# Patient Record
Sex: Male | Born: 1965 | Race: White | Hispanic: No | Marital: Single | Smoking: Current every day smoker
Health system: Southern US, Community
[De-identification: ages and names within clinical notes are randomized; demographics above are authoritative.]

## PROBLEM LIST (undated history)

## (undated) DIAGNOSIS — I10 Essential (primary) hypertension: Secondary | ICD-10-CM

## (undated) HISTORY — DX: Essential (primary) hypertension: I10

---

## 2019-11-23 DIAGNOSIS — R739 Hyperglycemia, unspecified: Secondary | ICD-10-CM

## 2019-11-23 DIAGNOSIS — R7309 Other abnormal glucose: Secondary | ICD-10-CM

## 2019-11-23 DIAGNOSIS — E559 Vitamin D deficiency, unspecified: Secondary | ICD-10-CM

## 2019-11-23 DIAGNOSIS — E786 Lipoprotein deficiency: Secondary | ICD-10-CM

## 2019-11-23 DIAGNOSIS — E119 Type 2 diabetes mellitus without complications: Secondary | ICD-10-CM

## 2019-11-23 HISTORY — DX: Hyperglycemia, unspecified: R73.9

## 2019-11-23 HISTORY — DX: Type 2 diabetes mellitus without complications: E11.9

## 2019-11-23 HISTORY — DX: Vitamin D deficiency, unspecified: E55.9

## 2019-11-23 HISTORY — DX: Other abnormal glucose: R73.09

## 2019-11-23 HISTORY — DX: Lipoprotein deficiency: E78.6

## 2019-12-04 ENCOUNTER — Telehealth: Payer: Self-pay | Admitting: Family Medicine

## 2019-12-04 NOTE — Telephone Encounter (Signed)
Pt called and reminded of appointment 

## 2019-12-07 ENCOUNTER — Encounter: Payer: Self-pay | Admitting: Family Medicine

## 2019-12-07 ENCOUNTER — Ambulatory Visit (INDEPENDENT_AMBULATORY_CARE_PROVIDER_SITE_OTHER): Payer: Self-pay | Admitting: Family Medicine

## 2019-12-07 ENCOUNTER — Other Ambulatory Visit: Payer: Self-pay

## 2019-12-07 VITALS — BP 142/94 | HR 94 | Temp 98.3°F | Ht 69.0 in | Wt 222.6 lb

## 2019-12-07 DIAGNOSIS — I10 Essential (primary) hypertension: Secondary | ICD-10-CM

## 2019-12-07 DIAGNOSIS — R7309 Other abnormal glucose: Secondary | ICD-10-CM

## 2019-12-07 DIAGNOSIS — Z09 Encounter for follow-up examination after completed treatment for conditions other than malignant neoplasm: Secondary | ICD-10-CM

## 2019-12-07 DIAGNOSIS — Z7689 Persons encountering health services in other specified circumstances: Secondary | ICD-10-CM

## 2019-12-07 DIAGNOSIS — Z0000 Encounter for general adult medical examination without abnormal findings: Secondary | ICD-10-CM

## 2019-12-07 DIAGNOSIS — E119 Type 2 diabetes mellitus without complications: Secondary | ICD-10-CM

## 2019-12-07 LAB — POCT GLYCOSYLATED HEMOGLOBIN (HGB A1C): Hemoglobin A1C: 12.3 % — AB (ref 4.0–5.6)

## 2019-12-07 LAB — GLUCOSE, POCT (MANUAL RESULT ENTRY): POC Glucose: 12.3 mg/dl — AB (ref 70–99)

## 2019-12-07 LAB — POCT URINALYSIS DIPSTICK
Bilirubin, UA: NEGATIVE
Blood, UA: NEGATIVE
Glucose, UA: POSITIVE — AB
Ketones, UA: NEGATIVE
Leukocytes, UA: NEGATIVE
Nitrite, UA: NEGATIVE
Protein, UA: POSITIVE — AB
Spec Grav, UA: 1.02 (ref 1.010–1.025)
Urobilinogen, UA: 0.2 E.U./dL
pH, UA: 5 (ref 5.0–8.0)

## 2019-12-07 MED ORDER — METFORMIN HCL 1000 MG PO TABS: 1000.0000 mg | ORAL_TABLET | Freq: Two times a day (BID) | ORAL | 3 refills | Status: DC

## 2019-12-07 MED ORDER — GLIPIZIDE 10 MG PO TABS: 10.0000 mg | ORAL_TABLET | Freq: Two times a day (BID) | ORAL | 3 refills | Status: DC

## 2019-12-07 MED ORDER — AMLODIPINE-ATORVASTATIN 10-10 MG PO TABS: 1.0000 | ORAL_TABLET | Freq: Every day | ORAL | 3 refills | Status: DC

## 2019-12-07 MED ORDER — BLOOD GLUCOSE METER KIT: PACK | 0 refills | Status: AC

## 2019-12-07 MED FILL — !TRUE METRIX BLOOD GLUCOSE: 30 days supply | Qty: 1 | Fill #0

## 2019-12-07 MED FILL — glipiZIDE 10 MG TABS: 10 | 30 days supply | Qty: 60 | Fill #0

## 2019-12-07 MED FILL — TRUE METRIX TEST STRIP: 25 days supply | Qty: 100 | Fill #0

## 2019-12-07 MED FILL — TRUEplus LANCETS 28G MISC: 25 days supply | Qty: 100 | Fill #0

## 2019-12-07 MED FILL — metFORMIN HCL 1000 MG TABS: 1000 | 30 days supply | Qty: 60 | Fill #0

## 2019-12-07 NOTE — Progress Notes (Signed)
Patient [facility]   Established Patient Office Visit  Subjective:  Patient ID: Johnny Hamilton, male    DOB: 12-20-1965  Age: 54 y.o. MRN: [id number]  CC:  Chief Complaint  Patient presents with  . New Patient (Initial Visit)    HTN    HPI Johnny Hamilton is a 54 year old male who presents to Establish Care today.   Past Medical History:  Diagnosis Date  . Hypertension     Current Status: This will be his initial office visit with me. He was previously not seeing a physician for his PCP needs. Since his last office visit, He is doing well with no complaints. He has elevated blood pressures today, which he states that he has not taken his anit-hypertensive medication as of yet. He denies visual changes, chest pain, cough, shortness of breath, heart palpitations, and falls. He has occasional headaches and dizziness with position changes. Denies severe headaches, confusion, seizures, double vision, and blurred vision, nausea and vomiting. He denies fatigue, frequent urination, blurred vision, excessive hunger, excessive thirst, weight gain, weight loss, and poor wound healing. He continues to check his feet regularly. He denies fevers, chills, recent infections, weight loss, and night sweats. No reports of GI problems such as diarrhea, and constipation. He has no reports of blood in stools, dysuria and hematuria. No depression or anxiety, and denies suicidal ideations, homicidal ideations, or auditory hallucinations. He denies pain today.   History reviewed. No pertinent surgical history.  No family history on file.  Social History   Socioeconomic History  . Marital status: Single    Spouse name: Not on file  . Number of children: Not on file  . Years of education: Not on file  . Highest education level: Not on file  Occupational History  . Not on file  Tobacco Use  . Smoking status: Current Every Day Smoker    Types: Cigarettes  . Smokeless  tobacco: Never Used  Substance and Sexual Activity  . Alcohol use: Not Currently  . Drug use: Not Currently  . Sexual activity: Not Currently  Other Topics Concern  . Not on file  Social History Narrative  . Not on file   Social Determinants of Health   Financial Resource Strain:   . Difficulty of Paying Living Expenses:   Food Insecurity:   . Worried About Charity fundraiser in the Last Year:   . Arboriculturist in the Last Year:   Transportation Needs:   . Film/video editor (Medical):   Marland Kitchen Lack of Transportation (Non-Medical):   Physical Activity:   . Days of Exercise per Week:   . Minutes of Exercise per Session:   Stress:   . Feeling of Stress :   Social Connections:   . Frequency of Communication with Friends and Family:   . Frequency of Social Gatherings with Friends and Family:   . Attends Religious Services:   . Active Member of Clubs or Organizations:   . Attends Archivist Meetings:   Marland Kitchen Marital Status:   Intimate Partner Violence:   . Fear of Current or Ex-Partner:   . Emotionally Abused:   Marland Kitchen Physically Abused:   . Sexually Abused:     Outpatient Medications Prior to Visit  Medication Sig Dispense Refill  . amlodipine-atorvastatin (CADUET) 10-10 MG tablet Take 1 tablet by mouth daily.     No facility-administered medications prior to visit.    No Known  Allergies  ROS Review of Systems  Constitutional: Negative.   HENT: Negative.   Eyes: Negative.   Respiratory: Negative.   Cardiovascular: Negative.   Gastrointestinal: Negative.   Endocrine: Negative.   Genitourinary: Negative.   Musculoskeletal: Negative.   Skin: Negative.   Allergic/Immunologic: Negative.   Neurological: Positive for dizziness (occasional ) and headaches (occasional ).  Hematological: Negative.   Psychiatric/Behavioral: Negative.       Objective:    Physical Exam  Constitutional: He is oriented to person, place, and time. He appears well-developed and  well-nourished.  HENT:  Head: Normocephalic and atraumatic.  Eyes: Conjunctivae are normal.  Cardiovascular: Normal rate, regular rhythm, normal heart sounds and intact distal pulses.  Pulmonary/Chest: Effort normal and breath sounds normal.  Abdominal: Soft. Bowel sounds are normal.  Musculoskeletal:        General: Normal range of motion.     Cervical back: Normal range of motion and neck supple.  Neurological: He is alert and oriented to person, place, and time. He has normal reflexes.  Skin: Skin is warm.  Psychiatric: He has a normal mood and affect. His behavior is normal. Judgment and thought content normal.  Nursing note and vitals reviewed.   BP (!) 142/94   Pulse 94   Temp 98.3 F (36.8 C)   Ht '5\' 9"'$  (1.753 m)   Wt 222 lb 9.6 oz (101 kg)   BMI 32.87 kg/m  Wt Readings from Last 3 Encounters:  12/07/19 222 lb 9.6 oz (101 kg)     Health Maintenance Due  Topic Date Due  . URINE MICROALBUMIN  Never done  . HIV Screening  Never done  . TETANUS/TDAP  Never done  . COLONOSCOPY  Never done  . INFLUENZA VACCINE  Never done    There are no preventive care reminders to display for this patient.  Lab Results  Component Value Date   TSH 3.270 12/07/2019   Lab Results  Component Value Date   WBC 6.7 12/07/2019   HGB 17.4 12/07/2019   HCT 52.5 (H) 12/07/2019   MCV 94 12/07/2019   PLT 242 12/07/2019   Lab Results  Component Value Date   NA 135 12/07/2019   K 4.5 12/07/2019   CO2 20 12/07/2019   GLUCOSE 339 (H) 12/07/2019   BUN 10 12/07/2019   CREATININE 0.88 12/07/2019   BILITOT 0.3 12/07/2019   ALKPHOS 78 12/07/2019   AST 21 12/07/2019   ALT 28 12/07/2019   PROT 7.6 12/07/2019   ALBUMIN 4.8 12/07/2019   CALCIUM 9.7 12/07/2019   Lab Results  Component Value Date   CHOL 196 12/07/2019   Lab Results  Component Value Date   HDL 41 12/07/2019   Lab Results  Component Value Date   LDLCALC 116 (H) 12/07/2019   Lab Results  Component Value Date    TRIG 224 (H) 12/07/2019   Lab Results  Component Value Date   CHOLHDL 4.8 12/07/2019   Lab Results  Component Value Date   HGBA1C 12.3 (A) 12/07/2019      Assessment & Plan:   1. Encounter to establish care  2. Newly diagnosed diabetes ([place]) He will continue medication as prescribed, to decrease foods/beverages high in sugars and carbs and follow Heart Healthy or DASH diet. Increase physical activity to at least 30 minutes cardio exercise daily.  - blood glucose meter kit and supplies; Dispense based on patient and insurance preference. Use up to four times daily as directed. (FOR ICD-10 E10.9, E11.9).  Dispense: 1 each; Refill: 0 - amlodipine-atorvastatin (CADUET) 10-10 MG tablet; Take 1 tablet by mouth daily.  Dispense: 30 tablet; Refill: 3 - glipiZIDE (GLUCOTROL) 10 MG tablet; Take 1 tablet (10 mg total) by mouth 2 (two) times daily before a meal.  Dispense: 60 tablet; Refill: 3 - metFORMIN (GLUCOPHAGE) 1000 MG tablet; Take 1 tablet (1,000 mg total) by mouth 2 (two) times daily with a meal.  Dispense: 180 tablet; Refill: 3  3. Hemoglobin A1c > 9% indicating poor diabetic control Hgb A1c increasd at 12.3 today. We will continue to monitor.   4. Hypertension, unspecified type Blood pressure is stable. He will continue to take medications as prescribed, to decrease high sodium intake, excessive alcohol intake, increase potassium intake, smoking cessation, and increase physical activity of at least 30 minutes of cardio activity daily. He will continue to follow Heart Healthy or DASH diet. - amlodipine-atorvastatin (CADUET) 10-10 MG tablet; Take 1 tablet by mouth daily.  Dispense: 30 tablet; Refill: 3 - glipiZIDE (GLUCOTROL) 10 MG tablet; Take 1 tablet (10 mg total) by mouth 2 (two) times daily before a meal.  Dispense: 60 tablet; Refill: 3 - metFORMIN (GLUCOPHAGE) 1000 MG tablet; Take 1 tablet (1,000 mg total) by mouth 2 (two) times daily with a meal.  Dispense: 180 tablet; Refill:  3  4. Health care maintenance - POCT glycosylated hemoglobin (Hb A1C) - POCT urinalysis dipstick - POCT glucose (manual entry) - CBC with Differential - Comprehensive metabolic panel - Lipid Panel - TSH - Vitamin B12 - Vitamin D, 25-hydroxy  5. Follow up He will follow up in 3 months.   Meds ordered this encounter  Medications  . blood glucose meter kit and supplies    Sig: Dispense based on patient and insurance preference. Use up to four times daily as directed. (FOR ICD-10 E10.9, E11.9).    Dispense:  1 each    Refill:  0    Order Specific Question:   Number of strips    Answer:   100    Order Specific Question:   Number of lancets    Answer:   100  . DISCONTD: glipiZIDE (GLUCOTROL) 10 MG tablet    Sig: Take 1 tablet (10 mg total) by mouth 2 (two) times daily before a meal.    Dispense:  60 tablet    Refill:  3  . DISCONTD: metFORMIN (GLUCOPHAGE) 1000 MG tablet    Sig: Take 1 tablet (1,000 mg total) by mouth 2 (two) times daily with a meal.    Dispense:  180 tablet    Refill:  3  . DISCONTD: amlodipine-atorvastatin (CADUET) 10-10 MG tablet    Sig: Take 1 tablet by mouth daily.    Dispense:  30 tablet    Refill:  3  . amlodipine-atorvastatin (CADUET) 10-10 MG tablet    Sig: Take 1 tablet by mouth daily.    Dispense:  30 tablet    Refill:  3  . glipiZIDE (GLUCOTROL) 10 MG tablet    Sig: Take 1 tablet (10 mg total) by mouth 2 (two) times daily before a meal.    Dispense:  60 tablet    Refill:  3  . metFORMIN (GLUCOPHAGE) 1000 MG tablet    Sig: Take 1 tablet (1,000 mg total) by mouth 2 (two) times daily with a meal.    Dispense:  180 tablet    Refill:  3    Orders Placed This Encounter  Procedures  . CBC with Differential  . Comprehensive  metabolic panel  . Lipid Panel  . TSH  . Vitamin B12  . Vitamin D, 25-hydroxy  . POCT glycosylated hemoglobin (Hb A1C)  . POCT urinalysis dipstick  . POCT glucose (manual entry)    Referral Orders  No referral(s)  requested today    Kathe Becton,  MSN, FNP-BC [address] [place], [place] [address] [phone] fax   Problem List Items Addressed This Visit    None    Visit Diagnoses    Encounter to establish care    -  Primary   Newly diagnosed diabetes ([place])       Relevant Medications   blood glucose meter kit and supplies   amlodipine-atorvastatin (CADUET) 10-10 MG tablet   glipiZIDE (GLUCOTROL) 10 MG tablet   metFORMIN (GLUCOPHAGE) 1000 MG tablet   Hypertension, unspecified type       Relevant Medications   amlodipine-atorvastatin (CADUET) 10-10 MG tablet   glipiZIDE (GLUCOTROL) 10 MG tablet   metFORMIN (GLUCOPHAGE) 1000 MG tablet   Health care maintenance       Relevant Orders   POCT glycosylated hemoglobin (Hb A1C) (Completed)   POCT urinalysis dipstick (Completed)   POCT glucose (manual entry) (Completed)   CBC with Differential (Completed)   Comprehensive metabolic panel (Completed)   Lipid Panel (Completed)   TSH (Completed)   Vitamin B12 (Completed)   Vitamin D, 25-hydroxy (Completed)   Follow up          Meds ordered this encounter  Medications  . blood glucose meter kit and supplies    Sig: Dispense based on patient and insurance preference. Use up to four times daily as directed. (FOR ICD-10 E10.9, E11.9).    Dispense:  1 each    Refill:  0    Order Specific Question:   Number of strips    Answer:   100    Order Specific Question:   Number of lancets    Answer:   100  . DISCONTD: glipiZIDE (GLUCOTROL) 10 MG tablet    Sig: Take 1 tablet (10 mg total) by mouth 2 (two) times daily before a meal.    Dispense:  60 tablet    Refill:  3  . DISCONTD: metFORMIN (GLUCOPHAGE) 1000 MG tablet    Sig: Take 1 tablet (1,000 mg total) by mouth 2 (two) times daily with a meal.    Dispense:  180 tablet    Refill:  3  . DISCONTD: amlodipine-atorvastatin (CADUET) 10-10 MG tablet      Sig: Take 1 tablet by mouth daily.    Dispense:  30 tablet    Refill:  3  . amlodipine-atorvastatin (CADUET) 10-10 MG tablet    Sig: Take 1 tablet by mouth daily.    Dispense:  30 tablet    Refill:  3  . glipiZIDE (GLUCOTROL) 10 MG tablet    Sig: Take 1 tablet (10 mg total) by mouth 2 (two) times daily before a meal.    Dispense:  60 tablet    Refill:  3  . metFORMIN (GLUCOPHAGE) 1000 MG tablet    Sig: Take 1 tablet (1,000 mg total) by mouth 2 (two) times daily with a meal.    Dispense:  180 tablet    Refill:  3    Follow-up: Return in about 3 months (around 03/08/2020).    Azzie Glatter, FNP

## 2019-12-07 NOTE — Patient Instructions (Signed)
Glipizide tablets What is this medicine? GLIPIZIDE (GLIP i zide) helps to treat type 2 diabetes. Treatment is combined with diet and exercise. The medicine helps your body to use insulin better. This medicine may be used for other purposes; ask your health care provider or pharmacist if you have questions. COMMON BRAND NAME(S): Glucotrol What should I tell my health care provider before I take this medicine? They need to know if you have any of these conditions:  diabetic ketoacidosis  glucose-6-phosphate dehydrogenase deficiency  heart disease  kidney disease  liver disease  porphyria  severe infection or injury  thyroid disease  an unusual or allergic reaction to glipizide, sulfa drugs, other medicines, foods, dyes, or preservatives  pregnant or trying to get pregnant  breast-feeding How should I use this medicine? Take this medicine by mouth. Swallow with a drink of water. Do not take with food. Take it 30 minutes before a meal. Follow the directions on the prescription label. If you take this medicine once a day, take it 30 minutes before breakfast. Take your doses at the same time each day. Do not take more often than directed. Talk to your pediatrician regarding the use of this medicine in children. Special care may be needed. Elderly patients over 78 years old may have a stronger reaction and need a smaller dose. Overdosage: If you think you have taken too much of this medicine contact a poison control center or emergency room at once. NOTE: This medicine is only for you. Do not share this medicine with others. What if I miss a dose? If you miss a dose, take it as soon as you can. If it is almost time for your next dose, take only that dose. Do not take double or extra doses. What may interact with this medicine?  bosentan  chloramphenicol  cisapride  clarithromycin  medicines for fungal or yeast infections  metoclopramide  probenecid  warfarin Many  medications may cause an increase or decrease in blood sugar, these include:  alcohol containing beverages  aspirin and aspirin-like drugs  chloramphenicol  chromium  diuretics  male hormones, like estrogens or progestins and birth control pills  heart medicines  isoniazid  male hormones or anabolic steroids  medicines for weight loss  medicines for allergies, asthma, cold, or cough  medicines for mental problems  medicines called MAO Inhibitors like Nardil, Parnate, Marplan, Eldepryl  niacin  NSAIDs, medicines for pain and inflammation, like ibuprofen or naproxen  pentamidine  phenytoin  probenecid  quinolone antibiotics like ciprofloxacin, levofloxacin, ofloxacin  some herbal dietary supplements  steroid medicines like prednisone or cortisone  thyroid medicine This list may not describe all possible interactions. Give your health care provider a list of all the medicines, herbs, non-prescription drugs, or dietary supplements you use. Also tell them if you smoke, drink alcohol, or use illegal drugs. Some items may interact with your medicine. What should I watch for while using this medicine? Visit your doctor or health care professional for regular checks on your progress. A test called the HbA1C (A1C) will be monitored. This is a simple blood test. It measures your blood sugar control over the last 2 to 3 months. You will receive this test every 3 to 6 months. Learn how to check your blood sugar. Learn the symptoms of low and high blood sugar and how to manage them. Always carry a quick-source of sugar with you in case you have symptoms of low blood sugar. Examples include hard sugar candy or  glucose tablets. Make sure others know that you can choke if you eat or drink when you develop serious symptoms of low blood sugar, such as seizures or unconsciousness. They must get medical help at once. Tell your doctor or health care professional if you have high blood  sugar. You might need to change the dose of your medicine. If you are sick or exercising more than usual, you might need to change the dose of your medicine. Do not skip meals. Ask your doctor or health care professional if you should avoid alcohol. Many nonprescription cough and cold products contain sugar or alcohol. These can affect blood sugar. This medicine can make you more sensitive to the sun. Keep out of the sun. If you cannot avoid being in the sun, wear protective clothing and use sunscreen. Do not use sun lamps or tanning beds/booths. Wear a medical ID bracelet or chain, and carry a card that describes your disease and details of your medicine and dosage times. What side effects may I notice from receiving this medicine? Side effects that you should report to your doctor or health care professional as soon as possible:  allergic reactions like skin rash, itching or hives, swelling of the face, lips, or tongue  breathing problems  dark urine  fever, chills, sore throat  signs and symptoms of low blood sugar such as feeling anxious, confusion, dizziness, increased hunger, unusually weak or tired, sweating, shakiness, cold, irritable, headache, blurred vision, fast heartbeat, loss of consciousness  unusual bleeding or bruising  yellowing of the eyes or skin Side effects that usually do not require medical attention (report to your doctor or health care professional if they continue or are bothersome):  diarrhea  dizziness  headache  heartburn  nausea  stomach gas This list may not describe all possible side effects. Call your doctor for medical advice about side effects. You may report side effects to FDA at 1-800-FDA-1088. Where should I keep my medicine? Keep out of the reach of children. Store at room temperature below 30 degrees C (86 degrees F). Throw away any unused medicine after the expiration date. NOTE: This sheet is a summary. It may not cover all possible  information. If you have questions about this medicine, talk to your doctor, pharmacist, or health care provider.  2020 Elsevier/Gold Standard (2012-12-24 14:42:46) Metformin tablets What is this medicine? METFORMIN (met FOR min) is used to treat type 2 diabetes. It helps to control blood sugar. Treatment is combined with diet and exercise. This medicine can be used alone or with other medicines for diabetes. This medicine may be used for other purposes; ask your health care provider or pharmacist if you have questions. COMMON BRAND NAME(S): Glucophage What should I tell my health care provider before I take this medicine? They need to know if you have any of these conditions:  anemia  dehydration  heart disease  frequently drink alcohol-containing beverages  kidney disease  liver disease  polycystic ovary syndrome  serious infection or injury  vomiting  an unusual or allergic reaction to metformin, other medicines, foods, dyes, or preservatives  pregnant or trying to get pregnant  breast-feeding How should I use this medicine? Take this medicine by mouth with a glass of water. Follow the directions on the prescription label. Take this medicine with food. Take your medicine at regular intervals. Do not take your medicine more often than directed. Do not stop taking except on your doctor's advice. Talk to your pediatrician regarding the use  of this medicine in children. While this drug may be prescribed for children as young as 83 years of age for selected conditions, precautions do apply. Overdosage: If you think you have taken too much of this medicine contact a poison control center or emergency room at once. NOTE: This medicine is only for you. Do not share this medicine with others. What if I miss a dose? If you miss a dose, take it as soon as you can. If it is almost time for your next dose, take only that dose. Do not take double or extra doses. What may interact with  this medicine? Do not take this medicine with any of the following medications:  certain contrast medicines given before X-rays, CT scans, MRI, or other procedures  dofetilide This medicine may also interact with the following medications:  acetazolamide  alcohol  certain antivirals for HIV or hepatitis  certain medicines for blood pressure, heart disease, irregular heart beat  cimetidine  dichlorphenamide  digoxin  diuretics  male hormones, like estrogens or progestins and birth control pills  glycopyrrolate  isoniazid  lamotrigine  memantine  methazolamide  metoclopramide  midodrine  niacin  phenothiazines like chlorpromazine, mesoridazine, prochlorperazine, thioridazine  phenytoin  ranolazine  steroid medicines like prednisone or cortisone  stimulant medicines for attention disorders, weight loss, or to stay awake  thyroid medicines  topiramate  trospium  vandetanib  zonisamide This list may not describe all possible interactions. Give your health care provider a list of all the medicines, herbs, non-prescription drugs, or dietary supplements you use. Also tell them if you smoke, drink alcohol, or use illegal drugs. Some items may interact with your medicine. What should I watch for while using this medicine? Visit your doctor or health care professional for regular checks on your progress. A test called the HbA1C (A1C) will be monitored. This is a simple blood test. It measures your blood sugar control over the last 2 to 3 months. You will receive this test every 3 to 6 months. Learn how to check your blood sugar. Learn the symptoms of low and high blood sugar and how to manage them. Always carry a quick-source of sugar with you in case you have symptoms of low blood sugar. Examples include hard sugar candy or glucose tablets. Make sure others know that you can choke if you eat or drink when you develop serious symptoms of low blood sugar, such  as seizures or unconsciousness. They must get medical help at once. Tell your doctor or health care professional if you have high blood sugar. You might need to change the dose of your medicine. If you are sick or exercising more than usual, you might need to change the dose of your medicine. Do not skip meals. Ask your doctor or health care professional if you should avoid alcohol. Many nonprescription cough and cold products contain sugar or alcohol. These can affect blood sugar. This medicine may cause ovulation in premenopausal women who do not have regular monthly periods. This may increase your chances of becoming pregnant. You should not take this medicine if you become pregnant or think you may be pregnant. Talk with your doctor or health care professional about your birth control options while taking this medicine. Contact your doctor or health care professional right away if you think you are pregnant. If you are going to need surgery, a MRI, CT scan, or other procedure, tell your doctor that you are taking this medicine. You may need to stop taking  this medicine before the procedure. Wear a medical ID bracelet or chain, and carry a card that describes your disease and details of your medicine and dosage times. This medicine may cause a decrease in folic acid and vitamin B12. You should make sure that you get enough vitamins while you are taking this medicine. Discuss the foods you eat and the vitamins you take with your health care professional. What side effects may I notice from receiving this medicine? Side effects that you should report to your doctor or health care professional as soon as possible:  allergic reactions like skin rash, itching or hives, swelling of the face, lips, or tongue  breathing problems  feeling faint or lightheaded, falls  muscle aches or pains  signs and symptoms of low blood sugar such as feeling anxious, confusion, dizziness, increased hunger, unusually  weak or tired, sweating, shakiness, cold, irritable, headache, blurred vision, fast heartbeat, loss of consciousness  slow or irregular heartbeat  unusual stomach pain or discomfort  unusually tired or weak Side effects that usually do not require medical attention (report to your doctor or health care professional if they continue or are bothersome):  diarrhea  headache  heartburn  metallic taste in mouth  nausea  stomach gas, upset This list may not describe all possible side effects. Call your doctor for medical advice about side effects. You may report side effects to FDA at 1-800-FDA-1088. Where should I keep my medicine? Keep out of the reach of children. Store at room temperature between 15 and 30 degrees C (59 and 86 degrees F). Protect from moisture and light. Throw away any unused medicine after the expiration date. NOTE: This sheet is a summary. It may not cover all possible information. If you have questions about this medicine, talk to your doctor, pharmacist, or health care provider.  2020 Elsevier/Gold Standard (2017-10-17 19:15:19) Diabetes Mellitus and Nutrition, Adult When you have diabetes (diabetes mellitus), it is very important to have healthy eating habits because your blood sugar (glucose) levels are greatly affected by what you eat and drink. Eating healthy foods in the appropriate amounts, at about the same times every day, can help you:  Control your blood glucose.  Lower your risk of heart disease.  Improve your blood pressure.  Reach or maintain a healthy weight. Every person with diabetes is different, and each person has different needs for a meal plan. Your health care provider may recommend that you work with a diet and nutrition specialist (dietitian) to make a meal plan that is best for you. Your meal plan may vary depending on factors such as:  The calories you need.  The medicines you take.  Your weight.  Your blood glucose, blood  pressure, and cholesterol levels.  Your activity level.  Other health conditions you have, such as heart or kidney disease. How do carbohydrates affect me? Carbohydrates, also called carbs, affect your blood glucose level more than any other type of food. Eating carbs naturally raises the amount of glucose in your blood. Carb counting is a method for keeping track of how many carbs you eat. Counting carbs is important to keep your blood glucose at a healthy level, especially if you use insulin or take certain oral diabetes medicines. It is important to know how many carbs you can safely have in each meal. This is different for every person. Your dietitian can help you calculate how many carbs you should have at each meal and for each snack. Foods that contain  carbs include:  Bread, cereal, rice, pasta, and crackers.  Potatoes and corn.  Peas, beans, and lentils.  Milk and yogurt.  Fruit and juice.  Desserts, such as cakes, cookies, ice cream, and candy. How does alcohol affect me? Alcohol can cause a sudden decrease in blood glucose (hypoglycemia), especially if you use insulin or take certain oral diabetes medicines. Hypoglycemia can be a life-threatening condition. Symptoms of hypoglycemia (sleepiness, dizziness, and confusion) are similar to symptoms of having too much alcohol. If your health care provider says that alcohol is safe for you, follow these guidelines:  Limit alcohol intake to no more than 1 drink per day for nonpregnant women and 2 drinks per day for men. One drink equals 12 oz of beer, 5 oz of wine, or 1 oz of hard liquor.  Do not drink on an empty stomach.  Keep yourself hydrated with water, diet soda, or unsweetened iced tea.  Keep in mind that regular soda, juice, and other mixers may contain a lot of sugar and must be counted as carbs. What are tips for following this plan?  Reading food labels  Start by checking the serving size on the "Nutrition Facts"  label of packaged foods and drinks. The amount of calories, carbs, fats, and other nutrients listed on the label is based on one serving of the item. Many items contain more than one serving per package.  Check the total grams (g) of carbs in one serving. You can calculate the number of servings of carbs in one serving by dividing the total carbs by 15. For example, if a food has 30 g of total carbs, it would be equal to 2 servings of carbs.  Check the number of grams (g) of saturated and trans fats in one serving. Choose foods that have low or no amount of these fats.  Check the number of milligrams (mg) of salt (sodium) in one serving. Most people should limit total sodium intake to less than 2,300 mg per day.  Always check the nutrition information of foods labeled as "low-fat" or "nonfat". These foods may be higher in added sugar or refined carbs and should be avoided.  Talk to your dietitian to identify your daily goals for nutrients listed on the label. Shopping  Avoid buying canned, premade, or processed foods. These foods tend to be high in fat, sodium, and added sugar.  Shop around the outside edge of the grocery store. This includes fresh fruits and vegetables, bulk grains, fresh meats, and fresh dairy. Cooking  Use low-heat cooking methods, such as baking, instead of high-heat cooking methods like deep frying.  Cook using healthy oils, such as olive, canola, or sunflower oil.  Avoid cooking with butter, cream, or high-fat meats. Meal planning  Eat meals and snacks regularly, preferably at the same times every day. Avoid going long periods of time without eating.  Eat foods high in fiber, such as fresh fruits, vegetables, beans, and whole grains. Talk to your dietitian about how many servings of carbs you can eat at each meal.  Eat 4-6 ounces (oz) of lean protein each day, such as lean meat, chicken, fish, eggs, or tofu. One oz of lean protein is equal to: ? 1 oz of meat,  chicken, or fish. ? 1 egg. ?  cup of tofu.  Eat some foods each day that contain healthy fats, such as avocado, nuts, seeds, and fish. Lifestyle  Check your blood glucose regularly.  Exercise regularly as told by your health care  provider. This may include: ? 150 minutes of moderate-intensity or vigorous-intensity exercise each week. This could be brisk walking, biking, or water aerobics. ? Stretching and doing strength exercises, such as yoga or weightlifting, at least 2 times a week.  Take medicines as told by your health care provider.  Do not use any products that contain nicotine or tobacco, such as cigarettes and e-cigarettes. If you need help quitting, ask your health care provider.  Work with a Social worker or diabetes educator to identify strategies to manage stress and any emotional and social challenges. Questions to ask a health care provider  Do I need to meet with a diabetes educator?  Do I need to meet with a dietitian?  What number can I call if I have questions?  When are the best times to check my blood glucose? Where to find more information:  American Diabetes Association: diabetes.org  Academy of Nutrition and Dietetics: www.eatright.[employer] of Diabetes and Digestive and Kidney Diseases (NIH): [employer] Summary  A healthy meal plan will help you control your blood glucose and maintain a healthy lifestyle.  Working with a diet and nutrition specialist (dietitian) can help you make a meal plan that is best for you.  Keep in mind that carbohydrates (carbs) and alcohol have immediate effects on your blood glucose levels. It is important to count carbs and to use alcohol carefully. This information is not intended to replace advice given to you by your health care provider. Make sure you discuss any questions you have with your health care provider. Document Revised: 08/23/2017 Document Reviewed: 10/15/2016 Elsevier Patient Education   2020 [place] Eating Plan DASH stands for "Dietary Approaches to Stop Hypertension." The DASH eating plan is a healthy eating plan that has been shown to reduce high blood pressure (hypertension). It may also reduce your risk for type 2 diabetes, heart disease, and stroke. The DASH eating plan may also help with weight loss. What are tips for following this plan?  General guidelines  Avoid eating more than 2,300 mg (milligrams) of salt (sodium) a day. If you have hypertension, you may need to reduce your sodium intake to 1,500 mg a day.  Limit alcohol intake to no more than 1 drink a day for nonpregnant women and 2 drinks a day for men. One drink equals 12 oz of beer, 5 oz of wine, or 1 oz of hard liquor.  Work with your health care provider to maintain a healthy body weight or to lose weight. Ask what an ideal weight is for you.  Get at least 30 minutes of exercise that causes your heart to beat faster (aerobic exercise) most days of the week. Activities may include walking, swimming, or biking.  Work with your health care provider or diet and nutrition specialist (dietitian) to adjust your eating plan to your individual calorie needs. Reading food labels   Check food labels for the amount of sodium per serving. Choose foods with less than 5 percent of the Daily Value of sodium. Generally, foods with less than 300 mg of sodium per serving fit into this eating plan.  To find whole grains, look for the word "whole" as the first word in the ingredient list. Shopping  Buy products labeled as "low-sodium" or "no salt added."  Buy fresh foods. Avoid canned foods and premade or frozen meals. Cooking  Avoid adding salt when cooking. Use salt-free seasonings or herbs instead of table salt or sea salt. Check with your health  care provider or pharmacist before using salt substitutes.  Do not fry foods. Cook foods using healthy methods such as baking, boiling, grilling, and broiling  instead.  Cook with heart-healthy oils, such as olive, canola, soybean, or sunflower oil. Meal planning  Eat a balanced diet that includes: ? 5 or more servings of fruits and vegetables each day. At each meal, try to fill half of your plate with fruits and vegetables. ? Up to 6-8 servings of whole grains each day. ? Less than 6 oz of lean meat, poultry, or fish each day. A 3-oz serving of meat is about the same size as a deck of cards. One egg equals 1 oz. ? 2 servings of low-fat dairy each day. ? A serving of nuts, seeds, or beans 5 times each week. ? Heart-healthy fats. Healthy fats called Omega-3 fatty acids are found in foods such as flaxseeds and coldwater fish, like sardines, salmon, and mackerel.  Limit how much you eat of the following: ? Canned or prepackaged foods. ? Food that is high in trans fat, such as fried foods. ? Food that is high in saturated fat, such as fatty meat. ? Sweets, desserts, sugary drinks, and other foods with added sugar. ? Full-fat dairy products.  Do not salt foods before eating.  Try to eat at least 2 vegetarian meals each week.  Eat more home-cooked food and less restaurant, buffet, and fast food.  When eating at a restaurant, ask that your food be prepared with less salt or no salt, if possible. What foods are recommended? The items listed may not be a complete list. Talk with your dietitian about what dietary choices are best for you. Grains Whole-grain or whole-wheat bread. Whole-grain or whole-wheat pasta. Brown rice. [place]. Bulgur. Whole-grain and low-sodium cereals. Pita bread. Low-fat, low-sodium crackers. Whole-wheat flour tortillas. Vegetables Fresh or frozen vegetables (raw, steamed, roasted, or grilled). Low-sodium or reduced-sodium tomato and vegetable juice. Low-sodium or reduced-sodium tomato sauce and tomato paste. Low-sodium or reduced-sodium canned vegetables. Fruits All fresh, dried, or frozen fruit. Canned fruit in  natural juice (without added sugar). Meat and other protein foods Skinless chicken or [place]. Ground chicken or [place]. Pork with fat trimmed off. Fish and seafood. Egg whites. Dried beans, peas, or lentils. Unsalted nuts, nut butters, and seeds. Unsalted canned beans. Lean cuts of beef with fat trimmed off. Low-sodium, lean deli meat. Dairy Low-fat (1%) or fat-free (skim) milk. Fat-free, low-fat, or reduced-fat cheeses. Nonfat, low-sodium ricotta or cottage cheese. Low-fat or nonfat yogurt. Low-fat, low-sodium cheese. Fats and oils Soft margarine without trans fats. Vegetable oil. Low-fat, reduced-fat, or light mayonnaise and salad dressings (reduced-sodium). Canola, safflower, olive, soybean, and sunflower oils. Avocado. Seasoning and other foods Herbs. Spices. Seasoning mixes without salt. Unsalted popcorn and pretzels. Fat-free sweets. What foods are not recommended? The items listed may not be a complete list. Talk with your dietitian about what dietary choices are best for you. Grains Baked goods made with fat, such as croissants, muffins, or some breads. Dry pasta or rice meal packs. Vegetables Creamed or fried vegetables. Vegetables in a cheese sauce. Regular canned vegetables (not low-sodium or reduced-sodium). Regular canned tomato sauce and paste (not low-sodium or reduced-sodium). Regular tomato and vegetable juice (not low-sodium or reduced-sodium). Angie Fava. Olives. Fruits Canned fruit in a light or heavy syrup. Fried fruit. Fruit in cream or butter sauce. Meat and other protein foods Fatty cuts of meat. Ribs. Fried meat. Berniece Salines. Sausage. Bologna and other processed lunch meats. Salami. Fatback.  Hotdogs. Bratwurst. Salted nuts and seeds. Canned beans with added salt. Canned or smoked fish. Whole eggs or egg yolks. Chicken or [place] with skin. Dairy Whole or 2% milk, cream, and half-and-half. Whole or full-fat cream cheese. Whole-fat or sweetened yogurt. Full-fat cheese. Nondairy  creamers. Whipped toppings. Processed cheese and cheese spreads. Fats and oils Butter. Stick margarine. Lard. Shortening. Ghee. Bacon fat. Tropical oils, such as coconut, palm kernel, or palm oil. Seasoning and other foods Salted popcorn and pretzels. Onion salt, garlic salt, seasoned salt, table salt, and sea salt. Worcestershire sauce. Tartar sauce. Barbecue sauce. Teriyaki sauce. Soy sauce, including reduced-sodium. Steak sauce. Canned and packaged gravies. Fish sauce. Oyster sauce. Cocktail sauce. Horseradish that you find on the shelf. Ketchup. Mustard. Meat flavorings and tenderizers. Bouillon cubes. Hot sauce and Tabasco sauce. Premade or packaged marinades. Premade or packaged taco seasonings. Relishes. Regular salad dressings. Where to find more information:  National Heart, Lung, and [place]: https://wilson-eaton.com/  American Heart Association: www.heart.org Summary  The DASH eating plan is a healthy eating plan that has been shown to reduce high blood pressure (hypertension). It may also reduce your risk for type 2 diabetes, heart disease, and stroke.  With the DASH eating plan, you should limit salt (sodium) intake to 2,300 mg a day. If you have hypertension, you may need to reduce your sodium intake to 1,500 mg a day.  When on the DASH eating plan, aim to eat more fresh fruits and vegetables, whole grains, lean proteins, low-fat dairy, and heart-healthy fats.  Work with your health care provider or diet and nutrition specialist (dietitian) to adjust your eating plan to your individual calorie needs. This information is not intended to replace advice given to you by your health care provider. Make sure you discuss any questions you have with your health care provider. Document Revised: 08/23/2017 Document Reviewed: 09/03/2016 Elsevier Patient Education  2020 [employer].

## 2019-12-08 ENCOUNTER — Telehealth: Payer: Self-pay

## 2019-12-08 DIAGNOSIS — I10 Essential (primary) hypertension: Secondary | ICD-10-CM | POA: Insufficient documentation

## 2019-12-08 DIAGNOSIS — R7309 Other abnormal glucose: Secondary | ICD-10-CM | POA: Insufficient documentation

## 2019-12-08 DIAGNOSIS — E119 Type 2 diabetes mellitus without complications: Secondary | ICD-10-CM | POA: Insufficient documentation

## 2019-12-08 NOTE — Telephone Encounter (Signed)
[facility] called:   Amlodipine-Atorvastatin together is to costly for Johnny Hamilton.  It is cheaper to dispense the 2 medication separately.   Please advise or re-submit.

## 2019-12-09 ENCOUNTER — Telehealth: Payer: Self-pay

## 2019-12-09 ENCOUNTER — Other Ambulatory Visit: Payer: Self-pay | Admitting: Family Medicine

## 2019-12-09 DIAGNOSIS — I10 Essential (primary) hypertension: Secondary | ICD-10-CM

## 2019-12-09 DIAGNOSIS — E119 Type 2 diabetes mellitus without complications: Secondary | ICD-10-CM

## 2019-12-09 MED ORDER — AMLODIPINE BESYLATE 10 MG PO TABS: 10.0000 mg | ORAL_TABLET | Freq: Every day | ORAL | 3 refills | Status: DC

## 2019-12-09 MED ORDER — ATORVASTATIN CALCIUM 10 MG PO TABS: 10.0000 mg | ORAL_TABLET | Freq: Every day | ORAL | 3 refills | Status: DC

## 2019-12-09 MED FILL — ATORVASTATIN 10 MG TABLET: 10 | 90 days supply | Qty: 90 | Fill #0

## 2019-12-09 MED FILL — AMLODIPINE BESYLATE 10 MG T: 10 | 90 days supply | Qty: 90 | Fill #0

## 2019-12-09 NOTE — Telephone Encounter (Signed)
Patient called today:   Mr. Gittleman will need the Amlodipine & Atorvastatin sent in separately. The combined medication is too expensive.   Please send to the [facility]   Thank you.

## 2019-12-14 ENCOUNTER — Encounter: Payer: Self-pay | Admitting: Family Medicine

## 2019-12-14 ENCOUNTER — Other Ambulatory Visit: Payer: Self-pay | Admitting: Family Medicine

## 2019-12-14 DIAGNOSIS — E559 Vitamin D deficiency, unspecified: Secondary | ICD-10-CM

## 2019-12-14 MED ORDER — VITAMIN D (ERGOCALCIFEROL) 1.25 MG (50000 UNIT) PO CAPS: 50000.0000 [IU] | ORAL_CAPSULE | ORAL | 6 refills | Status: DC

## 2019-12-16 ENCOUNTER — Telehealth: Payer: Self-pay | Admitting: Family Medicine

## 2019-12-16 LAB — LIPID PANEL
Chol/HDL Ratio: 4.8 ratio (ref 0.0–5.0)
Cholesterol, Total: 196 mg/dL (ref 100–199)
HDL: 41 mg/dL (ref >39)
LDL Chol Calc (NIH): 116 mg/dL — ABNORMAL HIGH (ref 0–99)
Triglycerides: 224 mg/dL — ABNORMAL HIGH (ref 0–149)
VLDL Cholesterol Cal: 39 mg/dL (ref 5–40)

## 2019-12-16 LAB — CBC WITH DIFFERENTIAL/PLATELET
Basophils Absolute: 0.1 10*3/uL (ref 0.0–0.2)
Basos: 1 %
EOS (ABSOLUTE): 0.1 10*3/uL (ref 0.0–0.4)
Eos: 2 %
Hematocrit: 52.5 % — ABNORMAL HIGH (ref 37.5–51.0)
Hemoglobin: 17.4 g/dL (ref 13.0–17.7)
Immature Grans (Abs): 0 10*3/uL (ref 0.0–0.1)
Immature Granulocytes: 0 %
Lymphocytes Absolute: 2.5 10*3/uL (ref 0.7–3.1)
Lymphs: 38 %
MCH: 31.2 pg (ref 26.6–33.0)
MCHC: 33.1 g/dL (ref 31.5–35.7)
MCV: 94 fL (ref 79–97)
Monocytes Absolute: 0.5 10*3/uL (ref 0.1–0.9)
Monocytes: 7 %
Neutrophils Absolute: 3.5 10*3/uL (ref 1.4–7.0)
Neutrophils: 52 %
Platelets: 242 10*3/uL (ref 150–450)
RBC: 5.58 x10E6/uL (ref 4.14–5.80)
RDW: 12.9 % (ref 11.6–15.4)
WBC: 6.7 10*3/uL (ref 3.4–10.8)

## 2019-12-16 LAB — COMPREHENSIVE METABOLIC PANEL
ALT: 28 IU/L (ref 0–44)
AST: 21 IU/L (ref 0–40)
Albumin/Globulin Ratio: 1.7 (ref 1.2–2.2)
Albumin: 4.8 g/dL (ref 3.8–4.9)
Alkaline Phosphatase: 78 IU/L (ref 39–117)
BUN/Creatinine Ratio: 11 (ref 9–20)
BUN: 10 mg/dL (ref 6–24)
Bilirubin Total: 0.3 mg/dL (ref 0.0–1.2)
CO2: 20 mmol/L (ref 20–29)
Calcium: 9.7 mg/dL (ref 8.7–10.2)
Chloride: 97 mmol/L (ref 96–106)
Creatinine, Ser: 0.88 mg/dL (ref 0.76–1.27)
GFR calc Af Amer: 115 mL/min/{1.73_m2} (ref >59)
GFR calc non Af Amer: 99 mL/min/{1.73_m2} (ref >59)
Globulin, Total: 2.8 g/dL (ref 1.5–4.5)
Glucose: 339 mg/dL — ABNORMAL HIGH (ref 65–99)
Potassium: 4.5 mmol/L (ref 3.5–5.2)
Sodium: 135 mmol/L (ref 134–144)
Total Protein: 7.6 g/dL (ref 6.0–8.5)

## 2019-12-16 LAB — VITAMIN B12: Vitamin B-12: 470 pg/mL (ref 232–1245)

## 2019-12-16 LAB — VITAMIN D 25 HYDROXY (VIT D DEFICIENCY, FRACTURES): Vit D, 25-Hydroxy: 11.2 ng/mL — ABNORMAL LOW (ref 30.0–100.0)

## 2019-12-16 LAB — TSH: TSH: 3.27 u[IU]/mL (ref 0.450–4.500)

## 2019-12-16 MED FILL — VIT D2 1.25 MG (50,000 UNIT: 1.25 MG | 35 days supply | Qty: 5 | Fill #0

## 2019-12-16 NOTE — Telephone Encounter (Signed)
Done

## 2019-12-23 ENCOUNTER — Ambulatory Visit: Payer: Self-pay | Attending: Family Medicine

## 2019-12-23 ENCOUNTER — Other Ambulatory Visit: Payer: Self-pay

## 2019-12-30 ENCOUNTER — Other Ambulatory Visit: Payer: Self-pay

## 2019-12-30 ENCOUNTER — Other Ambulatory Visit: Payer: Self-pay | Admitting: Family Medicine

## 2019-12-30 ENCOUNTER — Encounter: Payer: Self-pay | Admitting: Family Medicine

## 2019-12-30 ENCOUNTER — Telehealth: Payer: Self-pay | Admitting: Family Medicine

## 2019-12-30 ENCOUNTER — Ambulatory Visit (INDEPENDENT_AMBULATORY_CARE_PROVIDER_SITE_OTHER): Payer: Self-pay | Admitting: Family Medicine

## 2019-12-30 VITALS — BP 136/79 | HR 90 | Temp 98.6°F | Ht 69.0 in | Wt 202.4 lb

## 2019-12-30 DIAGNOSIS — I10 Essential (primary) hypertension: Secondary | ICD-10-CM

## 2019-12-30 DIAGNOSIS — R7309 Other abnormal glucose: Secondary | ICD-10-CM

## 2019-12-30 DIAGNOSIS — E119 Type 2 diabetes mellitus without complications: Secondary | ICD-10-CM

## 2019-12-30 DIAGNOSIS — E162 Hypoglycemia, unspecified: Secondary | ICD-10-CM

## 2019-12-30 DIAGNOSIS — Z09 Encounter for follow-up examination after completed treatment for conditions other than malignant neoplasm: Secondary | ICD-10-CM

## 2019-12-30 LAB — POCT URINALYSIS DIPSTICK
Bilirubin, UA: NEGATIVE
Blood, UA: NEGATIVE
Glucose, UA: POSITIVE — AB
Ketones, UA: NEGATIVE
Leukocytes, UA: NEGATIVE
Nitrite, UA: NEGATIVE
Protein, UA: POSITIVE — AB
Spec Grav, UA: 1.025 (ref 1.010–1.025)
Urobilinogen, UA: 0.2 E.U./dL
pH, UA: 5.5 (ref 5.0–8.0)

## 2019-12-30 LAB — GLUCOSE, POCT (MANUAL RESULT ENTRY): POC Glucose: 115 mg/dl — AB (ref 70–99)

## 2019-12-30 MED ORDER — METFORMIN HCL 500 MG PO TABS: 500.0000 mg | ORAL_TABLET | Freq: Two times a day (BID) | ORAL | 3 refills | Status: DC

## 2019-12-30 MED ORDER — TRUE METRIX BLOOD GLUCOSE TEST VI STRP: ORAL_STRIP | 12 refills | Status: DC

## 2019-12-30 MED FILL — TRUE METRIX TEST STRIP: 25 days supply | Qty: 100 | Fill #0

## 2019-12-30 MED FILL — metFORMIN HCL 500 MG TABS: 500 | 30 days supply | Qty: 60 | Fill #0

## 2019-12-30 NOTE — Progress Notes (Signed)
[facility]    Sick Visit  Subjective:  Patient ID: Johnny Hamilton, male    DOB: 1966/08/22  Age: 54 y.o. MRN: [id number]  CC:  Chief Complaint  Patient presents with  . Follow-up    dm    HPI Johnny Hamilton is a 54 year old male who presents for Sick Visit today.  Past Medical History:  Diagnosis Date  . Diabetes ([place]) 11/2019  . Hemoglobin A1C greater than 9%, indicating poor diabetic control 11/2019  . Hyperglycemia 11/2019  . Hypertension   . Hypolipidemia 11/2019  . Vitamin D deficiency 11/2019   Current Status: Since his last office visit, recently, he has noticed that his blood glucose levels have been below 70. He is eating healthier, 3 meals a day plus snacks. He has increased his physical activity, and is drinking mostly water. His most recent normal range of preprandial blood glucose levels have been between 140-180. He has seen low range of 48, which he did eat a piece of chocolate candy and blood glucose level rose to 90s; and high of 241 since his last office visit. He denies fatigue, frequent urination, blurred vision, excessive hunger, excessive thirst, weight gain, weight loss, and poor wound healing. He continues to check his feet regularly. Blood pressures via home monitoring are normally 115/80's. He denies fevers, chills, fatigue, recent infections, weight loss, and night sweats. He has not had any headaches, visual changes, dizziness, and falls. No chest pain, heart palpitations, cough and shortness of breath reported. No reports of GI problems such as nausea, vomiting, diarrhea, and constipation. He has no reports of blood in stools, dysuria and hematuria. No depression or anxiety reported today. He denies suicidal ideations, homicidal ideations, or auditory hallucinations. He is taking all medications as prescribed. He denies pain today.   History reviewed. No pertinent surgical history.  History reviewed. No pertinent  family history.  Social History   Socioeconomic History  . Marital status: Single    Spouse name: Not on file  . Number of children: Not on file  . Years of education: Not on file  . Highest education level: Not on file  Occupational History  . Not on file  Tobacco Use  . Smoking status: Current Every Day Smoker    Types: Cigarettes  . Smokeless tobacco: Never Used  Substance and Sexual Activity  . Alcohol use: Not Currently  . Drug use: Not Currently  . Sexual activity: Not Currently  Other Topics Concern  . Not on file  Social History Narrative  . Not on file   Social Determinants of Health   Financial Resource Strain:   . Difficulty of Paying Living Expenses:   Food Insecurity:   . Worried About Charity fundraiser in the Last Year:   . Arboriculturist in the Last Year:   Transportation Needs:   . Film/video editor (Medical):   Marland Kitchen Lack of Transportation (Non-Medical):   Physical Activity:   . Days of Exercise per Week:   . Minutes of Exercise per Session:   Stress:   . Feeling of Stress :   Social Connections:   . Frequency of Communication with Friends and Family:   . Frequency of Social Gatherings with Friends and Family:   . Attends Religious Services:   . Active Member of Clubs or Organizations:   . Attends Archivist Meetings:   Marland Kitchen Marital Status:   Intimate Partner Violence:   .  Fear of Current or Ex-Partner:   . Emotionally Abused:   Marland Kitchen Physically Abused:   . Sexually Abused:     Outpatient Medications Prior to Visit  Medication Sig Dispense Refill  . amLODipine (NORVASC) 10 MG tablet Take 1 tablet (10 mg total) by mouth daily. 90 tablet 3  . atorvastatin (LIPITOR) 10 MG tablet Take 1 tablet (10 mg total) by mouth daily. 90 tablet 3  . blood glucose meter kit and supplies Dispense based on patient and insurance preference. Use up to four times daily as directed. (FOR ICD-10 E10.9, E11.9). 1 each 0  . glipiZIDE (GLUCOTROL) 10 MG tablet  Take 1 tablet (10 mg total) by mouth 2 (two) times daily before a meal. 60 tablet 3  . Vitamin D, Ergocalciferol, (DRISDOL) 1.25 MG (50000 UNIT) CAPS capsule Take 1 capsule (50,000 Units total) by mouth every 7 (seven) days. 5 capsule 6  . amlodipine-atorvastatin (CADUET) 10-10 MG tablet Take 1 tablet by mouth daily. 30 tablet 3  . metFORMIN (GLUCOPHAGE) 1000 MG tablet Take 1 tablet (1,000 mg total) by mouth 2 (two) times daily with a meal. 180 tablet 3   No facility-administered medications prior to visit.    No Known Allergies  ROS Review of Systems  Constitutional: Negative.   HENT: Negative.   Eyes: Negative.   Respiratory: Negative.   Cardiovascular: Negative.   Gastrointestinal: Positive for abdominal distention.  Endocrine: Negative.   Genitourinary: Negative.   Musculoskeletal: Negative.   Skin: Negative.   Allergic/Immunologic: Negative.   Neurological: Positive for dizziness (occasional ) and headaches (occasional ).  Hematological: Negative.   Psychiatric/Behavioral: Negative.       Objective:    Physical Exam  Constitutional: He is oriented to person, place, and time. He appears well-developed and well-nourished.  HENT:  Head: Normocephalic and atraumatic.  Eyes: Conjunctivae are normal.  Cardiovascular: Normal rate, regular rhythm, normal heart sounds and intact distal pulses.  Pulmonary/Chest: Effort normal and breath sounds normal.  Abdominal: Soft. Bowel sounds are normal. He exhibits distension.  Musculoskeletal:        General: Normal range of motion.     Cervical back: Normal range of motion and neck supple.  Neurological: He is alert and oriented to person, place, and time.  Skin: Skin is warm.  Psychiatric: He has a normal mood and affect. His behavior is normal. Judgment and thought content normal.  Nursing note and vitals reviewed.   BP 136/79   Pulse 90   Temp 98.6 F (37 C) (Oral)   Ht '5\' 9"'$  (1.753 m)   Wt 202 lb 6.4 oz (91.8 kg)   SpO2  97%   BMI 29.89 kg/m  Wt Readings from Last 3 Encounters:  12/30/19 202 lb 6.4 oz (91.8 kg)  12/07/19 222 lb 9.6 oz (101 kg)     Health Maintenance Due  Topic Date Due  . PNEUMOCOCCAL POLYSACCHARIDE VACCINE AGE 98-64 HIGH RISK  Never done  . FOOT EXAM  Never done  . OPHTHALMOLOGY EXAM  Never done  . URINE MICROALBUMIN  Never done  . HIV Screening  Never done  . TETANUS/TDAP  Never done  . COLONOSCOPY  Never done    There are no preventive care reminders to display for this patient.  Lab Results  Component Value Date   TSH 3.270 12/07/2019   Lab Results  Component Value Date   WBC 6.7 12/07/2019   HGB 17.4 12/07/2019   HCT 52.5 (H) 12/07/2019   MCV 94 12/07/2019  PLT 242 12/07/2019   Lab Results  Component Value Date   NA 135 12/07/2019   K 4.5 12/07/2019   CO2 20 12/07/2019   GLUCOSE 339 (H) 12/07/2019   BUN 10 12/07/2019   CREATININE 0.88 12/07/2019   BILITOT 0.3 12/07/2019   ALKPHOS 78 12/07/2019   AST 21 12/07/2019   ALT 28 12/07/2019   PROT 7.6 12/07/2019   ALBUMIN 4.8 12/07/2019   CALCIUM 9.7 12/07/2019   Lab Results  Component Value Date   CHOL 196 12/07/2019   Lab Results  Component Value Date   HDL 41 12/07/2019   Lab Results  Component Value Date   LDLCALC 116 (H) 12/07/2019   Lab Results  Component Value Date   TRIG 224 (H) 12/07/2019   Lab Results  Component Value Date   CHOLHDL 4.8 12/07/2019   Lab Results  Component Value Date   HGBA1C 12.3 (A) 12/07/2019      Assessment & Plan:   1. Newly diagnosed diabetes ([place]) He will continue medication as prescribed, to decrease foods/beverages high in sugars and carbs and follow Heart Healthy or DASH diet. Increase physical activity to at least 30 minutes cardio exercise daily.  - POCT urinalysis dipstick - POCT glucose (manual entry) - metFORMIN (GLUCOPHAGE) 500 MG tablet; Take 1 tablet (500 mg total) by mouth 2 (two) times daily with a meal.  Dispense: 60 tablet; Refill: 3 -  glucose blood (TRUE METRIX BLOOD GLUCOSE TEST) test strip; Use as instructed  Dispense: 100 each; Refill: 12  2. Hemoglobin A1C greater than 9%, indicating poor diabetic control Hgb A1c increased at 12.3 today. Monitor. - metFORMIN (GLUCOPHAGE) 500 MG tablet; Take 1 tablet (500 mg total) by mouth 2 (two) times daily with a meal.  Dispense: 60 tablet; Refill: 3 - glucose blood (TRUE METRIX BLOOD GLUCOSE TEST) test strip; Use as instructed  Dispense: 100 each; Refill: 12  3. Hypoglycemia - glucose blood (TRUE METRIX BLOOD GLUCOSE TEST) test strip; Use as instructed  Dispense: 100 each; Refill: 12  4. Hypertension, unspecified type The current medical regimen is effective; blood pressure is stable at 136/79 today; continue present plan and medications as prescribed. He will continue to take medications as prescribed, to decrease high sodium intake, excessive alcohol intake, increase potassium intake, smoking cessation, and increas physical activity of at least 30 minutes of cardio activity daily. He will continue to follow Heart Healthy or DASH diet.  5. Follow up He will keep follow up appointment for 02/2020.  Meds ordered this encounter  Medications  . metFORMIN (GLUCOPHAGE) 500 MG tablet    Sig: Take 1 tablet (500 mg total) by mouth 2 (two) times daily with a meal.    Dispense:  60 tablet    Refill:  3  . glucose blood (TRUE METRIX BLOOD GLUCOSE TEST) test strip    Sig: Use as instructed    Dispense:  100 each    Refill:  12    Orders Placed This Encounter  Procedures  . POCT urinalysis dipstick  . POCT glucose (manual entry)    Referral Orders  No referral(s) requested today    Kathe Becton,  MSN, FNP-BC [facility] [address] [place], [place] [address] [phone] fax  Problem List Items Addressed This Visit      Cardiovascular and Mediastinum   Hypertension     Endocrine    Hemoglobin A1C greater than 9%, indicating poor diabetic  control   Relevant Medications   metFORMIN (GLUCOPHAGE) 500 MG tablet   glucose blood (TRUE METRIX BLOOD GLUCOSE TEST) test strip   Newly diagnosed diabetes ([place]) - Primary   Relevant Medications   metFORMIN (GLUCOPHAGE) 500 MG tablet   glucose blood (TRUE METRIX BLOOD GLUCOSE TEST) test strip   Other Relevant Orders   POCT urinalysis dipstick (Completed)   POCT glucose (manual entry) (Completed)    Other Visit Diagnoses    Hypoglycemia       Relevant Medications   glucose blood (TRUE METRIX BLOOD GLUCOSE TEST) test strip   Follow up          Meds ordered this encounter  Medications  . metFORMIN (GLUCOPHAGE) 500 MG tablet    Sig: Take 1 tablet (500 mg total) by mouth 2 (two) times daily with a meal.    Dispense:  60 tablet    Refill:  3  . glucose blood (TRUE METRIX BLOOD GLUCOSE TEST) test strip    Sig: Use as instructed    Dispense:  100 each    Refill:  12    Follow-up: No follow-ups on file.    Azzie Glatter, FNP

## 2019-12-30 NOTE — Telephone Encounter (Signed)
Pt believes Bp meds may be too strong. Please call pt back.

## 2019-12-30 NOTE — Telephone Encounter (Signed)
Patient reported his glucose reading for the past 5-6 days have been 48-55.   Patient to see Dorene Grebe NP today at [facility].

## 2020-01-12 ENCOUNTER — Ambulatory Visit: Payer: Self-pay | Admitting: Internal Medicine

## 2020-01-14 ENCOUNTER — Other Ambulatory Visit: Payer: Self-pay

## 2020-01-14 ENCOUNTER — Encounter: Payer: Self-pay | Admitting: Nurse Practitioner

## 2020-01-14 ENCOUNTER — Ambulatory Visit (INDEPENDENT_AMBULATORY_CARE_PROVIDER_SITE_OTHER): Payer: Self-pay | Admitting: Nurse Practitioner

## 2020-01-14 VITALS — BP 135/89 | HR 86 | Temp 97.5°F | Resp 16 | Ht 69.0 in | Wt 215.0 lb

## 2020-01-14 DIAGNOSIS — K5904 Chronic idiopathic constipation: Secondary | ICD-10-CM

## 2020-01-14 DIAGNOSIS — R1012 Left upper quadrant pain: Secondary | ICD-10-CM

## 2020-01-14 DIAGNOSIS — R1032 Left lower quadrant pain: Secondary | ICD-10-CM

## 2020-01-14 LAB — POCT URINALYSIS DIPSTICK
Bilirubin, UA: NEGATIVE
Blood, UA: NEGATIVE
Glucose, UA: NEGATIVE
Ketones, UA: NEGATIVE
Leukocytes, UA: NEGATIVE
Nitrite, UA: NEGATIVE
Protein, UA: NEGATIVE
Spec Grav, UA: <=1.005 — AB (ref 1.010–1.025)
Urobilinogen, UA: 0.2 E.U./dL
pH, UA: 5.5 (ref 5.0–8.0)

## 2020-01-14 LAB — CBC WITH DIFFERENTIAL/PLATELET
Basophils Absolute: 0 10*3/uL (ref 0.0–0.2)
Basos: 1 %
EOS (ABSOLUTE): 0.1 10*3/uL (ref 0.0–0.4)
Eos: 2 %
Hematocrit: 50.1 % (ref 37.5–51.0)
Hemoglobin: 17.1 g/dL (ref 13.0–17.7)
Lymphocytes Absolute: 1.9 10*3/uL (ref 0.7–3.1)
Lymphs: 34 %
MCH: 31.5 pg (ref 26.6–33.0)
MCHC: 34.1 g/dL (ref 31.5–35.7)
MCV: 92 fL (ref 79–97)
Monocytes Absolute: 0.6 10*3/uL (ref 0.1–0.9)
Monocytes: 10 %
Neutrophils Absolute: 2.9 10*3/uL (ref 1.4–7.0)
Neutrophils: 53 %
Platelets: 271 10*3/uL (ref 150–450)
RBC: 5.42 x10E6/uL (ref 4.14–5.80)
RDW: 13.9 % (ref 11.6–15.4)
WBC: 5.4 10*3/uL (ref 3.4–10.8)

## 2020-01-14 NOTE — Patient Instructions (Signed)
Constipation, Adult Constipation is when a person:  Poops (has a bowel movement) fewer times in a week than normal.  Has a hard time pooping.  Has poop that is dry, hard, or bigger than normal. Follow these instructions at home: Eating and drinking   Eat foods that have a lot of fiber, such as: ? Fresh fruits and vegetables. ? Whole grains. ? Beans.  Eat less of foods that are high in fat, low in fiber, or overly processed, such as: ? [place] fries. ? Hamburgers. ? Cookies. ? Candy. ? Soda.  Drink enough fluid to keep your pee (urine) clear or pale yellow. General instructions  Exercise regularly or as told by your doctor.  Go to the restroom when you feel like you need to poop. Do not hold it in.  Take over-the-counter and prescription medicines only as told by your doctor. These include any fiber supplements.  Do pelvic floor retraining exercises, such as: ? Doing deep breathing while relaxing your lower belly (abdomen). ? Relaxing your pelvic floor while pooping.  Watch your condition for any changes.  Keep all follow-up visits as told by your doctor. This is important. Contact a doctor if:  You have pain that gets worse.  You have a fever.  You have not pooped for 4 days.  You throw up (vomit).  You are not hungry.  You lose weight.  You are bleeding from the anus.  You have thin, pencil-like poop (stool). Get help right away if:  You have a fever, and your symptoms suddenly get worse.  You leak poop or have blood in your poop.  Your belly feels hard or bigger than normal (is bloated).  You have very bad belly pain.  You feel dizzy or you faint. This information is not intended to replace advice given to you by your health care provider. Make sure you discuss any questions you have with your health care provider. Document Revised: 08/23/2017 Document Reviewed: 02/29/2016 Elsevier Patient Education  2020 Elsevier Inc. Docusate capsules What  is this medicine? DOCUSATE (doc CUE sayt) is stool softener. It helps prevent constipation and straining or discomfort associated with hard or dry stools. This medicine may be used for other purposes; ask your health care provider or pharmacist if you have questions. COMMON BRAND NAME(S): BeneHealth Stool Softner, Colace, Colace Clear, Correctol, D.O.S., DC, Doc-Q-Lace, DocuLace, Docusoft S, DOK, DOK Extra Strength, Dulcolax, Genasoft, Kao-Tin, Kaopectate Liqui-Gels, Phillips Stool Softener, Stool Softener, Stool Softner DC, Sulfolax, Sur-Q-Lax, Surfak, Uni-Ease  MIRALAX  What should I tell my health care provider before I take this medicine? They need to know if you have any of these conditions:  nausea or vomiting  severe constipation  stomach pain  sudden change in bowel habit lasting more than 2 weeks  an unusual or allergic reaction to docusate, other medicines, foods, dyes, or preservatives  pregnant or trying to get pregnant  breast-feeding How should I use this medicine? Take this medicine by mouth with a glass of water. Follow the directions on the label. Take your doses at regular intervals. Do not take your medicine more often than directed. Talk to your pediatrician regarding the use of this medicine in children. While this medicine may be prescribed for children as young as 2 years for selected conditions, precautions do apply. Overdosage: If you think you have taken too much of this medicine contact a poison control center or emergency room at once. NOTE: This medicine is only for you. Do not share  this medicine with others. What if I miss a dose? If you miss a dose, take it as soon as you can. If it is almost time for your next dose, take only that dose. Do not take double or extra doses. What may interact with this medicine?  mineral oil This list may not describe all possible interactions. Give your health care provider a list of all the medicines, herbs,  non-prescription drugs, or dietary supplements you use. Also tell them if you smoke, drink alcohol, or use illegal drugs. Some items may interact with your medicine. What should I watch for while using this medicine? Do not use for more than one week without advice from your doctor or health care professional. If your constipation returns, check with your doctor or health care professional. Drink plenty of water while taking this medicine. Drinking water helps decrease constipation. Stop using this medicine and contact your doctor or health care professional if you experience any rectal bleeding or do not have a bowel movement after use. These could be signs of a more serious condition. What side effects may I notice from receiving this medicine? Side effects that you should report to your doctor or health care professional as soon as possible:  allergic reactions like skin rash, itching or hives, swelling of the face, lips, or tongue Side effects that usually do not require medical attention (report to your doctor or health care professional if they continue or are bothersome):  diarrhea  stomach cramps  throat irritation This list may not describe all possible side effects. Call your doctor for medical advice about side effects. You may report side effects to FDA at 1-800-FDA-1088. Where should I keep my medicine? Keep out of the reach of children. Store at room temperature between 15 and 30 degrees C (59 and 86 degrees F). Throw away any unused medicine after the expiration date. NOTE: This sheet is a summary. It may not cover all possible information. If you have questions about this medicine, talk to your doctor, pharmacist, or health care provider.  2020 Elsevier/Gold Standard (2008-01-01 15:56:49)

## 2020-01-14 NOTE — Progress Notes (Signed)
Acute Office Visit  Subjective:    Patient ID: Johnny Hamilton, male    DOB: 1966-02-22, 54 y.o.   MRN: [id number]  Chief Complaint  Patient presents with  . Abdominal Pain    left side abdominal pain     HPI Patient is in today for abdominal pain. He  has a past medical history of Diabetes ([place]) (11/2019), Hemoglobin A1C greater than 9%, indicating poor diabetic control (11/2019), Hyperglycemia (11/2019), Hypertension, Hypolipidemia (11/2019), and Vitamin D deficiency (11/2019).    Abdominal Pain Patient complains of abdominal pain. The pain is described as dull, and is 2/10 in intensity. The patient is experiencing left flank pain without radiation. Onset was 4 days ago. Symptoms have been gradually improving. Aggravating factors: bowel movement.  Alleviating factors: acetaminophen. Associated symptoms: none. The patient denies anorexia, arthralagias, belching, chills, diarrhea, dysuria, fever, flatus, frequency, headache, hematochezia and hematuria. He has bouts of constipation. He has used prunes and mineral oil. He is staying hydrated. He has stopped fried foods and is walking 3-4 miles per day. He admits that he has made significant changes in his diet because of the diabetes. Denies headache, dizziness, visual changes, shortness of breath, dyspnea on exertion, chest pain, nausea, vomiting or any edema.    Past Medical History:  Diagnosis Date  . Diabetes ([place]) 11/2019  . Hemoglobin A1C greater than 9%, indicating poor diabetic control 11/2019  . Hyperglycemia 11/2019  . Hypertension   . Hypolipidemia 11/2019  . Vitamin D deficiency 11/2019    History reviewed. No pertinent surgical history.  History reviewed. No pertinent family history.  Social History   Socioeconomic History  . Marital status: Single    Spouse name: Not on file  . Number of children: Not on file  . Years of education: Not on file  . Highest education level: Not on file  Occupational History  . Not on  file  Tobacco Use  . Smoking status: Current Every Day Smoker    Types: Cigarettes  . Smokeless tobacco: Never Used  Substance and Sexual Activity  . Alcohol use: Not Currently  . Drug use: Not Currently  . Sexual activity: Not Currently  Other Topics Concern  . Not on file  Social History Narrative  . Not on file   Social Determinants of Health   Financial Resource Strain:   . Difficulty of Paying Living Expenses:   Food Insecurity:   . Worried About Charity fundraiser in the Last Year:   . Arboriculturist in the Last Year:   Transportation Needs:   . Film/video editor (Medical):   Marland Kitchen Lack of Transportation (Non-Medical):   Physical Activity:   . Days of Exercise per Week:   . Minutes of Exercise per Session:   Stress:   . Feeling of Stress :   Social Connections:   . Frequency of Communication with Friends and Family:   . Frequency of Social Gatherings with Friends and Family:   . Attends Religious Services:   . Active Member of Clubs or Organizations:   . Attends Archivist Meetings:   Marland Kitchen Marital Status:   Intimate Partner Violence:   . Fear of Current or Ex-Partner:   . Emotionally Abused:   Marland Kitchen Physically Abused:   . Sexually Abused:     Outpatient Medications Prior to Visit  Medication Sig Dispense Refill  . amLODipine (NORVASC) 10 MG tablet Take 1 tablet (10 mg total) by mouth daily. 90 tablet 3  .  atorvastatin (LIPITOR) 10 MG tablet Take 1 tablet (10 mg total) by mouth daily. 90 tablet 3  . blood glucose meter kit and supplies Dispense based on patient and insurance preference. Use up to four times daily as directed. (FOR ICD-10 E10.9, E11.9). 1 each 0  . glipiZIDE (GLUCOTROL) 10 MG tablet Take 1 tablet (10 mg total) by mouth 2 (two) times daily before a meal. 60 tablet 3  . glucose blood (TRUE METRIX BLOOD GLUCOSE TEST) test strip Use as instructed 100 each 12  . metFORMIN (GLUCOPHAGE) 500 MG tablet Take 1 tablet (500 mg total) by mouth 2 (two)  times daily with a meal. 60 tablet 3  . Vitamin D, Ergocalciferol, (DRISDOL) 1.25 MG (50000 UNIT) CAPS capsule Take 1 capsule (50,000 Units total) by mouth every 7 (seven) days. 5 capsule 6   No facility-administered medications prior to visit.    No Known Allergies  Review of Systems  All other systems reviewed and are negative.      Objective:    Physical Exam Constitutional:      Appearance: He is well-developed.  HENT:     Head: Normocephalic.  Cardiovascular:     Rate and Rhythm: Normal rate and regular rhythm.  Pulmonary:     Effort: Pulmonary effort is normal.     Breath sounds: Normal breath sounds.  Abdominal:     General: Bowel sounds are decreased.     Palpations: Abdomen is soft.     Tenderness: There is abdominal tenderness in the left lower quadrant.  Skin:    General: Skin is warm and dry.     Capillary Refill: Capillary refill takes less than 2 seconds.  Neurological:     General: No focal deficit present.     Mental Status: He is alert and oriented to person, place, and time.  Psychiatric:        Mood and Affect: Mood normal.        Behavior: Behavior normal.     BP 135/89 (BP Location: Left Arm, Patient Position: Sitting, Cuff Size: Normal)   Pulse 86   Temp (!) 97.5 F (36.4 C) (Oral)   Resp 16   Ht '5\' 9"'$  (1.753 m)   Wt 215 lb (97.5 kg)   SpO2 99%   BMI 31.75 kg/m  Wt Readings from Last 3 Encounters:  01/14/20 215 lb (97.5 kg)  12/30/19 202 lb 6.4 oz (91.8 kg)  12/07/19 222 lb 9.6 oz (101 kg)    Health Maintenance Due  Topic Date Due  . PNEUMOCOCCAL POLYSACCHARIDE VACCINE AGE 70-64 HIGH RISK  Never done  . FOOT EXAM  Never done  . OPHTHALMOLOGY EXAM  Never done  . URINE MICROALBUMIN  Never done  . HIV Screening  Never done  . COVID-19 Vaccine (1) Never done  . TETANUS/TDAP  Never done  . COLONOSCOPY  Never done    There are no preventive care reminders to display for this patient.   Lab Results  Component Value Date   TSH  3.270 12/07/2019   Lab Results  Component Value Date   WBC 5.4 01/14/2020   HGB 17.1 01/14/2020   HCT 50.1 01/14/2020   MCV 92 01/14/2020   PLT 271 01/14/2020   Lab Results  Component Value Date   NA 135 12/07/2019   K 4.5 12/07/2019   CO2 20 12/07/2019   GLUCOSE 339 (H) 12/07/2019   BUN 10 12/07/2019   CREATININE 0.88 12/07/2019   BILITOT 0.3 12/07/2019  ALKPHOS 78 12/07/2019   AST 21 12/07/2019   ALT 28 12/07/2019   PROT 7.6 12/07/2019   ALBUMIN 4.8 12/07/2019   CALCIUM 9.7 12/07/2019   Lab Results  Component Value Date   CHOL 196 12/07/2019   Lab Results  Component Value Date   HDL 41 12/07/2019   Lab Results  Component Value Date   LDLCALC 116 (H) 12/07/2019   Lab Results  Component Value Date   TRIG 224 (H) 12/07/2019   Lab Results  Component Value Date   CHOLHDL 4.8 12/07/2019   Lab Results  Component Value Date   HGBA1C 12.3 (A) 12/07/2019       Assessment & Plan:   Problem List Items Addressed This Visit      Unprioritized   Chronic idiopathic constipation    Other Visit Diagnoses    Left lower quadrant pain    -  Primary   CBC pending If symptoms persist will get abdominal x-ray   Relevant Orders   CBC with Differential/Platelet (Completed)   Left upper quadrant abdominal pain       Relevant Orders   Urinalysis Dipstick (Completed)   Cologuard       No orders of the defined types were placed in this encounter.    Vevelyn Francois, NP

## 2020-01-15 ENCOUNTER — Telehealth: Payer: Self-pay

## 2020-01-15 DIAGNOSIS — K5904 Chronic idiopathic constipation: Secondary | ICD-10-CM | POA: Insufficient documentation

## 2020-01-15 NOTE — Telephone Encounter (Signed)
Called, no answer. Left a message to call back. Thanks!  

## 2020-01-15 NOTE — Telephone Encounter (Signed)
Patient returned call, I advised that CBC is normal. Encouraged him to continue to treat the constipation and complete cologard when received. Patient verbalized understanding. Thanks!

## 2020-01-15 NOTE — Telephone Encounter (Signed)
-----   Message from Barbette Merino, NP sent at 01/14/2020  8:08 PM EDT ----- Let him know that his CBC is normal. So continue to treat the constipation and complete the cologuard at the appointed time. Thanks

## 2020-01-28 MED FILL — METFORMIN HCL 500 MG TABS: 500 | 30 days supply | Qty: 60 | Fill #1

## 2020-02-01 ENCOUNTER — Other Ambulatory Visit: Payer: Self-pay

## 2020-02-01 ENCOUNTER — Ambulatory Visit: Payer: Self-pay | Attending: Family Medicine

## 2020-02-02 ENCOUNTER — Telehealth: Payer: Self-pay | Admitting: Family Medicine

## 2020-02-02 ENCOUNTER — Other Ambulatory Visit: Payer: Self-pay | Admitting: Family Medicine

## 2020-02-02 DIAGNOSIS — K029 Dental caries, unspecified: Secondary | ICD-10-CM

## 2020-02-02 NOTE — Telephone Encounter (Signed)
Pt wants dental referral. He needs referral to participate in the [facility] access program.

## 2020-03-07 ENCOUNTER — Ambulatory Visit (INDEPENDENT_AMBULATORY_CARE_PROVIDER_SITE_OTHER): Payer: Self-pay | Admitting: Family Medicine

## 2020-03-07 ENCOUNTER — Other Ambulatory Visit: Payer: Self-pay

## 2020-03-07 VITALS — BP 122/79 | HR 92 | Temp 97.7°F | Ht 70.0 in | Wt 200.4 lb

## 2020-03-07 DIAGNOSIS — E162 Hypoglycemia, unspecified: Secondary | ICD-10-CM

## 2020-03-07 DIAGNOSIS — R1012 Left upper quadrant pain: Secondary | ICD-10-CM

## 2020-03-07 DIAGNOSIS — I10 Essential (primary) hypertension: Secondary | ICD-10-CM

## 2020-03-07 DIAGNOSIS — K029 Dental caries, unspecified: Secondary | ICD-10-CM

## 2020-03-07 DIAGNOSIS — E559 Vitamin D deficiency, unspecified: Secondary | ICD-10-CM

## 2020-03-07 DIAGNOSIS — R7309 Other abnormal glucose: Secondary | ICD-10-CM

## 2020-03-07 DIAGNOSIS — R739 Hyperglycemia, unspecified: Secondary | ICD-10-CM

## 2020-03-07 DIAGNOSIS — Z09 Encounter for follow-up examination after completed treatment for conditions other than malignant neoplasm: Secondary | ICD-10-CM

## 2020-03-07 DIAGNOSIS — E119 Type 2 diabetes mellitus without complications: Secondary | ICD-10-CM

## 2020-03-07 LAB — POCT URINALYSIS DIPSTICK
Bilirubin, UA: NEGATIVE
Blood, UA: NEGATIVE
Glucose, UA: NEGATIVE
Ketones, UA: NEGATIVE
Leukocytes, UA: NEGATIVE
Nitrite, UA: NEGATIVE
Protein, UA: POSITIVE — AB
Spec Grav, UA: >=1.03 — AB (ref 1.010–1.025)
Urobilinogen, UA: 0.2 E.U./dL
pH, UA: 5.5 (ref 5.0–8.0)

## 2020-03-07 LAB — POCT GLYCOSYLATED HEMOGLOBIN (HGB A1C)
HbA1c POC (<> result, manual entry): 5.6 % (ref 4.0–5.6)
HbA1c, POC (controlled diabetic range): 5.6 % (ref 0.0–7.0)
HbA1c, POC (prediabetic range): 5.6 % — AB (ref 5.7–6.4)
Hemoglobin A1C: 5.6 % (ref 4.0–5.6)

## 2020-03-07 MED FILL — ?ATORVASTATIN 10 MG TABLET: 10 | 90 days supply | Qty: 90 | Fill #1

## 2020-03-07 MED FILL — ?AMLODIPINE BESYL 10MG TABL: 10 | 30 days supply | Qty: 30 | Fill #1

## 2020-03-07 NOTE — Progress Notes (Signed)
Patient [facility]    Established Patient Office Visit  Subjective:  Patient ID: Johnny Hamilton, male    DOB: [birth year]  Age: 54 y.o. MRN: [id number]  CC: No chief complaint on file.   HPI Johnny Caputois a 54 year old who presents for Follow Up today.   Patient Active Problem List   Diagnosis Date Noted  . Chronic idiopathic constipation 01/15/2020  . Newly diagnosed diabetes ([place]) 12/08/2019  . Hemoglobin A1C greater than 9%, indicating poor diabetic control 12/08/2019  . Hypertension 12/08/2019     Past Medical History:  Diagnosis Date  . Diabetes ([place]) 11/2019  . Hemoglobin A1C greater than 9%, indicating poor diabetic control 11/2019  . Hyperglycemia 11/2019  . Hypertension   . Hypolipidemia 11/2019  . Vitamin D deficiency 11/2019     Current Status: Since his last office visit, he is doing well with no complaints. His most recent normal range of preprandial blood glucose levels have been between 90-101, in which he checks 2 times a day. He has seen low range of 82 and high of 124 since his last office visit. He denies fatigue, frequent urination, blurred vision, excessive hunger, excessive thirst, weight gain, weight loss, and poor wound healing. He continues to check his feet regularly. He has not taken anti-diabetic medications since his last visit with me 11/2019. He continues to eat a healthier diet and increases exercises by walking 8 mile a day. He denies visual changes, chest pain, cough, shortness of breath, heart palpitations, and falls. He has occasional headaches and dizziness with position changes. Denies severe headaches, confusion, seizures, double vision, and blurred vision, nausea and vomiting. He has not be able to afford $600 co-pay for Cologuard. He denies fevers, chills, recent infections, weight loss, and night sweats. Denies GI problems such as nausea, vomiting, diarrhea, and constipation. He has no reports of blood  in stools, dysuria and hematuria. No depression or anxiety, and denies suicidal ideations, homicidal ideations, or auditory hallucinations. He is taking all medications as prescribed. He denies pain today.    No past surgical history on file.  No family history on file.  Social History   Socioeconomic History  . Marital status: Single    Spouse name: Not on file  . Number of children: Not on file  . Years of education: Not on file  . Highest education level: Not on file  Occupational History  . Not on file  Tobacco Use  . Smoking status: Current Every Day Smoker    Types: Cigarettes  . Smokeless tobacco: Never Used  Vaping Use  . Vaping Use: Never used  Substance and Sexual Activity  . Alcohol use: Not Currently  . Drug use: Not Currently  . Sexual activity: Not Currently  Other Topics Concern  . Not on file  Social History Narrative  . Not on file   Social Determinants of Health   Financial Resource Strain:   . Difficulty of Paying Living Expenses:   Food Insecurity:   . Worried About Charity fundraiser in the Last Year:   . Arboriculturist in the Last Year:   Transportation Needs:   . Film/video editor (Medical):   Marland Kitchen Lack of Transportation (Non-Medical):   Physical Activity:   . Days of Exercise per Week:   . Minutes of Exercise per Session:   Stress:   . Feeling of Stress :   Social Connections:   . Frequency of  Communication with Friends and Family:   . Frequency of Social Gatherings with Friends and Family:   . Attends Religious Services:   . Active Member of Clubs or Organizations:   . Attends Archivist Meetings:   Marland Kitchen Marital Status:   Intimate Partner Violence:   . Fear of Current or Ex-Partner:   . Emotionally Abused:   Marland Kitchen Physically Abused:   . Sexually Abused:     Outpatient Medications Prior to Visit  Medication Sig Dispense Refill  . amLODipine (NORVASC) 10 MG tablet Take 1 tablet (10 mg total) by mouth daily. 90 tablet 3  .  atorvastatin (LIPITOR) 10 MG tablet Take 1 tablet (10 mg total) by mouth daily. 90 tablet 3  . blood glucose meter kit and supplies Dispense based on patient and insurance preference. Use up to four times daily as directed. (FOR ICD-10 E10.9, E11.9). 1 each 0  . glucose blood (TRUE METRIX BLOOD GLUCOSE TEST) test strip Use as instructed 100 each 12  . glipiZIDE (GLUCOTROL) 10 MG tablet Take 1 tablet (10 mg total) by mouth 2 (two) times daily before a meal. (Patient not taking: Reported on 03/07/2020) 60 tablet 3  . metFORMIN (GLUCOPHAGE) 500 MG tablet Take 1 tablet (500 mg total) by mouth 2 (two) times daily with a meal. (Patient not taking: Reported on 03/07/2020) 60 tablet 3  . Vitamin D, Ergocalciferol, (DRISDOL) 1.25 MG (50000 UNIT) CAPS capsule Take 1 capsule (50,000 Units total) by mouth every 7 (seven) days. (Patient not taking: Reported on 03/07/2020) 5 capsule 6   No facility-administered medications prior to visit.    No Known Allergies  ROS Review of Systems  Constitutional: Negative.   HENT: Negative.   Eyes: Negative.   Respiratory: Negative.   Cardiovascular: Negative.   Gastrointestinal: Positive for abdominal distention.  Endocrine: Negative.   Genitourinary: Negative.   Musculoskeletal: Negative.   Skin: Negative.   Allergic/Immunologic: Negative.   Neurological: Positive for dizziness (occasional) and headaches (occasional ).  Hematological: Negative.   Psychiatric/Behavioral: Negative.       Objective:    Physical Exam Vitals and nursing note reviewed.  Constitutional:      Appearance: Normal appearance. He is normal weight.  HENT:     Head: Normocephalic and atraumatic.     Nose: Nose normal.  Eyes:     Pupils: Pupils are equal, round, and reactive to light.  Cardiovascular:     Rate and Rhythm: Normal rate and regular rhythm.     Pulses: Normal pulses.     Heart sounds: Normal heart sounds.  Pulmonary:     Effort: Pulmonary effort is normal.      Breath sounds: Normal breath sounds.  Abdominal:     General: Bowel sounds are normal. There is distension (occasional ).     Palpations: Abdomen is soft.  Musculoskeletal:        General: Normal range of motion.     Cervical back: Normal range of motion and neck supple.  Skin:    General: Skin is warm and dry.  Neurological:     Mental Status: He is alert.  Psychiatric:        Mood and Affect: Mood normal.        Behavior: Behavior normal.        Thought Content: Thought content normal.        Judgment: Judgment normal.     BP 122/79 (BP Location: Right Arm, Patient Position: Sitting)   Pulse 92  Temp 97.7 F (36.5 C)   Ht '5\' 10"'$  (1.778 m)   Wt 200 lb 6.4 oz (90.9 kg)   SpO2 98%   BMI 28.75 kg/m  Wt Readings from Last 3 Encounters:  03/07/20 200 lb 6.4 oz (90.9 kg)  01/14/20 215 lb (97.5 kg)  12/30/19 202 lb 6.4 oz (91.8 kg)     Health Maintenance Due  Topic Date Due  . Hepatitis C Screening  Never done  . PNEUMOCOCCAL POLYSACCHARIDE VACCINE AGE 41-64 HIGH RISK  Never done  . FOOT EXAM  Never done  . OPHTHALMOLOGY EXAM  Never done  . URINE MICROALBUMIN  Never done  . COVID-19 Vaccine (1) Never done  . HIV Screening  Never done  . TETANUS/TDAP  Never done  . COLONOSCOPY  Never done    There are no preventive care reminders to display for this patient.  Lab Results  Component Value Date   TSH 3.270 12/07/2019   Lab Results  Component Value Date   WBC 5.4 01/14/2020   HGB 17.1 01/14/2020   HCT 50.1 01/14/2020   MCV 92 01/14/2020   PLT 271 01/14/2020   Lab Results  Component Value Date   NA 135 12/07/2019   K 4.5 12/07/2019   CO2 20 12/07/2019   GLUCOSE 339 (H) 12/07/2019   BUN 10 12/07/2019   CREATININE 0.88 12/07/2019   BILITOT 0.3 12/07/2019   ALKPHOS 78 12/07/2019   AST 21 12/07/2019   ALT 28 12/07/2019   PROT 7.6 12/07/2019   ALBUMIN 4.8 12/07/2019   CALCIUM 9.7 12/07/2019   Lab Results  Component Value Date   CHOL 196 12/07/2019    Lab Results  Component Value Date   HDL 41 12/07/2019   Lab Results  Component Value Date   LDLCALC 116 (H) 12/07/2019   Lab Results  Component Value Date   TRIG 224 (H) 12/07/2019   Lab Results  Component Value Date   CHOLHDL 4.8 12/07/2019   Lab Results  Component Value Date   HGBA1C 5.6 03/07/2020   HGBA1C 5.6 03/07/2020   HGBA1C 5.6 (A) 03/07/2020   HGBA1C 5.6 03/07/2020      Assessment & Plan:   1. Newly diagnosed diabetes ([place]) Well controlled with diet and exercise. He will continue to decrease foods/beverages high in sugars and carbs and follow Heart Healthy or DASH diet. Increase physical activity to at least 30 minutes cardio exercise daily.  - POCT glycosylated hemoglobin (Hb A1C) - POCT Urinalysis Dipstick  2. Hemoglobin A1c less than 7.0% Much improved! Hgb A1c at 5.6 today, from 12.3 on 12/07/2019. He will continue to manage diabetes with healthier diet, weight control, and daily exercise.   3. Hyperglycemia  4. Hypoglycemia Blood glucose levels decrease with medication use, therefore he has discontinued and controls diabetes via diet and exercise.   5. Hypertension, unspecified type The current medical regimen is effective; blood pressure is stable at 122/79 today; continue present plan and medications as prescribed. He will continue to take medications as prescribed, to decrease high sodium intake, excessive alcohol intake, increase potassium intake, smoking cessation, and increase physical activity of at least 30 minutes of cardio activity daily. He will continue to follow Heart Healthy or DASH diet.  6. Left upper quadrant abdominal pain Stable today.   7. Dental caries  8. Vitamin D deficiency  9. Follow up He will follow up in 6 months.    No orders of the defined types were placed in this encounter.  Orders Placed This Encounter  Procedures  . POCT glycosylated hemoglobin (Hb A1C)  . POCT Urinalysis Dipstick    Referral Orders  No  referral(s) requested today    Kathe Becton,  MSN, FNP-BC [place] [place], [place] [address] [phone]- fax  Problem List Items Addressed This Visit      Cardiovascular and Mediastinum   Hypertension     Endocrine   Hemoglobin A1C greater than 9%, indicating poor diabetic control   Newly diagnosed diabetes ([place]) - Primary   Relevant Orders   POCT glycosylated hemoglobin (Hb A1C) (Completed)   POCT Urinalysis Dipstick    Other Visit Diagnoses    Hyperglycemia       Hypoglycemia       Left upper quadrant abdominal pain       Dental caries       Vitamin D deficiency       Follow up          No orders of the defined types were placed in this encounter.   Follow-up: Return in about 6 months (around 09/06/2020).    Azzie Glatter, FNP

## 2020-04-05 MED FILL — ?AMLODIPINE BESYL 10MG TABL: 10 | 30 days supply | Qty: 30 | Fill #2

## 2020-04-24 DIAGNOSIS — T23409A Corrosion of unspecified degree of unspecified hand, unspecified site, initial encounter: Secondary | ICD-10-CM

## 2020-04-24 HISTORY — DX: Corrosion of unspecified degree of unspecified hand, unspecified site, initial encounter: T23.409A

## 2020-05-09 MED FILL — AMLODIPINE BESYLATE 10 MG T: 10 | 30 days supply | Qty: 30 | Fill #3

## 2020-06-01 ENCOUNTER — Encounter: Payer: Self-pay | Admitting: Family Medicine

## 2020-06-01 ENCOUNTER — Other Ambulatory Visit: Payer: Self-pay

## 2020-06-01 ENCOUNTER — Ambulatory Visit (INDEPENDENT_AMBULATORY_CARE_PROVIDER_SITE_OTHER): Payer: Self-pay | Admitting: Family Medicine

## 2020-06-01 VITALS — BP 111/76 | HR 79 | Temp 98.4°F | Resp 17 | Ht 70.0 in | Wt 197.4 lb

## 2020-06-01 DIAGNOSIS — Z09 Encounter for follow-up examination after completed treatment for conditions other than malignant neoplasm: Secondary | ICD-10-CM

## 2020-06-01 DIAGNOSIS — L299 Pruritus, unspecified: Secondary | ICD-10-CM

## 2020-06-01 DIAGNOSIS — I10 Essential (primary) hypertension: Secondary | ICD-10-CM

## 2020-06-01 DIAGNOSIS — R21 Rash and other nonspecific skin eruption: Secondary | ICD-10-CM

## 2020-06-01 DIAGNOSIS — R7309 Other abnormal glucose: Secondary | ICD-10-CM

## 2020-06-01 DIAGNOSIS — E119 Type 2 diabetes mellitus without complications: Secondary | ICD-10-CM

## 2020-06-01 DIAGNOSIS — R739 Hyperglycemia, unspecified: Secondary | ICD-10-CM

## 2020-06-01 DIAGNOSIS — T23669A Corrosion of second degree back of unspecified hand, initial encounter: Secondary | ICD-10-CM

## 2020-06-01 MED ORDER — HYDROXYZINE HCL 10 MG PO TABS: 10.0000 mg | ORAL_TABLET | Freq: Three times a day (TID) | ORAL | 3 refills | Status: DC | PRN

## 2020-06-01 MED ORDER — FLUOCINONIDE 0.05 % EX OINT: 1.0000 "application " | TOPICAL_OINTMENT | Freq: Two times a day (BID) | CUTANEOUS | 3 refills | Status: DC

## 2020-06-01 MED ORDER — PREDNISONE 10 MG PO TABS: ORAL_TABLET | ORAL | 0 refills | Status: DC

## 2020-06-01 MED ORDER — CETIRIZINE HCL 10 MG PO TABS: 10.0000 mg | ORAL_TABLET | Freq: Every day | ORAL | 11 refills | Status: DC

## 2020-06-01 MED FILL — FLUOCINONIDE 0.05% OINTMENT: 0.05 | 15 days supply | Qty: 30 | Fill #0

## 2020-06-01 MED FILL — ?PREDINSONE 10MG TABLETS: 10 | 6 days supply | Qty: 21 | Fill #0

## 2020-06-01 MED FILL — ?CETIRIZINE HCL 10 MG TABLE: 10 | 30 days supply | Qty: 30 | Fill #0

## 2020-06-01 MED FILL — hydrOXYzine HCL 10 MG TABS: 10 | 10 days supply | Qty: 30 | Fill #0

## 2020-06-01 MED FILL — ?ATORVASTATIN 10 MG TABLET: 10 | 90 days supply | Qty: 90 | Fill #2
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
## 2020-06-01 NOTE — Patient Instructions (Addendum)
Hydroxyzine capsules or tablets What is this medicine? HYDROXYZINE (hye DROX i zeen) is an antihistamine. This medicine is used to treat allergy symptoms. It is also used to treat anxiety and tension. This medicine can be used with other medicines to induce sleep before surgery. This medicine may be used for other purposes; ask your health care provider or pharmacist if you have questions. COMMON BRAND NAME(S): ANX, Atarax, Rezine, Vistaril What should I tell my health care provider before I take this medicine? They need to know if you have any of these conditions:  glaucoma  heart disease  history of irregular heartbeat  kidney disease  liver disease  lung or breathing disease, like asthma  stomach or intestine problems  thyroid disease  trouble passing urine  an unusual or allergic reaction to hydroxyzine, cetirizine, other medicines, foods, dyes or preservatives  pregnant or trying to get pregnant  breast-feeding How should I use this medicine? Take this medicine by mouth with a full glass of water. Follow the directions on the prescription label. You may take this medicine with food or on an empty stomach. Take your medicine at regular intervals. Do not take your medicine more often than directed. Talk to your pediatrician regarding the use of this medicine in children. Special care may be needed. While this drug may be prescribed for children as young as 6 years of age for selected conditions, precautions do apply. Patients over 65 years old may have a stronger reaction and need a smaller dose. Overdosage: If you think you have taken too much of this medicine contact a poison control center or emergency room at once. NOTE: This medicine is only for you. Do not share this medicine with others. What if I miss a dose? If you miss a dose, take it as soon as you can. If it is almost time for your next dose, take only that dose. Do not take double or extra doses. What may  interact with this medicine? Do not take this medicine with any of the following medications:  cisapride  dronedarone  pimozide  thioridazine This medicine may also interact with the following medications:  alcohol  antihistamines for allergy, cough, and cold  atropine  barbiturate medicines for sleep or seizures, like phenobarbital  certain antibiotics like erythromycin or clarithromycin  certain medicines for anxiety or sleep  certain medicines for bladder problems like oxybutynin, tolterodine  certain medicines for depression or psychotic disturbances  certain medicines for irregular heart beat  certain medicines for Parkinson's disease like benztropine, trihexyphenidyl  certain medicines for seizures like phenobarbital, primidone  certain medicines for stomach problems like dicyclomine, hyoscyamine  certain medicines for travel sickness like scopolamine  ipratropium  narcotic medicines for pain  other medicines that prolong the QT interval (an abnormal heart rhythm) like dofetilide This list may not describe all possible interactions. Give your health care provider a list of all the medicines, herbs, non-prescription drugs, or dietary supplements you use. Also tell them if you smoke, drink alcohol, or use illegal drugs. Some items may interact with your medicine. What should I watch for while using this medicine? Tell your doctor or health care professional if your symptoms do not improve. You may get drowsy or dizzy. Do not drive, use machinery, or do anything that needs mental alertness until you know how this medicine affects you. Do not stand or sit up quickly, especially if you are an older patient. This reduces the risk of dizzy or fainting spells. Alcohol may   interfere with the effect of this medicine. Avoid alcoholic drinks. Your mouth may get dry. Chewing sugarless gum or sucking hard candy, and drinking plenty of water may help. Contact your doctor if the  problem does not go away or is severe. This medicine may cause dry eyes and blurred vision. If you wear contact lenses you may feel some discomfort. Lubricating drops may help. See your eye doctor if the problem does not go away or is severe. If you are receiving skin tests for allergies, tell your doctor you are using this medicine. What side effects may I notice from receiving this medicine? Side effects that you should report to your doctor or health care professional as soon as possible:  allergic reactions like skin rash, itching or hives, swelling of the face, lips, or tongue  changes in vision  confusion  fast, irregular heartbeat  seizures  tremor  trouble passing urine or change in the amount of urine Side effects that usually do not require medical attention (report to your doctor or health care professional if they continue or are bothersome):  constipation  drowsiness  dry mouth  headache  tiredness This list may not describe all possible side effects. Call your doctor for medical advice about side effects. You may report side effects to FDA at 1-800-FDA-1088. Where should I keep my medicine? Keep out of the reach of children. Store at room temperature between 15 and 30 degrees C (59 and 86 degrees F). Keep container tightly closed. Throw away any unused medicine after the expiration date. NOTE: This sheet is a summary. It may not cover all possible information. If you have questions about this medicine, talk to your doctor, pharmacist, or health care provider.  2020 Else[employer]/Gold Standard (2018-09-01 13:19:55) Cetirizine tablets What is this medicine? CETIRIZINE (se TI ra zeen) is an antihistamine. This medicine is used to treat or prevent symptoms of allergies. It is also used to help reduce itchy skin rash and hives. This medicine may be used for other purposes; ask your health care provider or pharmacist if you have questions. COMMON BRAND NAME(S): All Day  Allergy, Allergy Relief, Zyrtec, Zyrtec Hives Relief What should I tell my health care provider before I take this medicine? They need to know if you have any of these conditions:  kidney disease  liver disease  an unusual or allergic reaction to cetirizine, hydroxyzine, other medicines, foods, dyes, or preservatives  pregnant or trying to get pregnant  breast-feeding How should I use this medicine? Take this medicine by mouth with a glass of water. Follow the directions on the prescription label. You can take this medicine with food or on an empty stomach. Take your medicine at regular times. Do not take more often than directed. You may need to take this medicine for several days before your symptoms improve. Talk to your pediatrician regarding the use of this medicine in children. Special care may be needed. While this drug may be prescribed for children as young as 60 years of age for selected conditions, precautions do apply. Overdosage: If you think you have taken too much of this medicine contact a poison control center or emergency room at once. NOTE: This medicine is only for you. Do not share this medicine with others. What if I miss a dose? If you miss a dose, take it as soon as you can. If it is almost time for your next dose, take only that dose. Do not take double or extra doses. What may  interact with this medicine?  alcohol  certain medicines for anxiety or sleep  narcotic medicines for pain  other medicines for colds or allergies This list may not describe all possible interactions. Give your health care provider a list of all the medicines, herbs, non-prescription drugs, or dietary supplements you use. Also tell them if you smoke, drink alcohol, or use illegal drugs. Some items may interact with your medicine. What should I watch for while using this medicine? Visit your doctor or health care professional for regular checks on your health. Tell your doctor if your  symptoms do not improve. You may get drowsy or dizzy. Do not drive, use machinery, or do anything that needs mental alertness until you know how this medicine affects you. Do not stand or sit up quickly, especially if you are an older patient. This reduces the risk of dizzy or fainting spells. Your mouth may get dry. Chewing sugarless gum or sucking hard candy, and drinking plenty of water may help. Contact your doctor if the problem does not go away or is severe. What side effects may I notice from receiving this medicine? Side effects that you should report to your doctor or health care professional as soon as possible:  allergic reactions like skin rash, itching or hives, swelling of the face, lips, or tongue  changes in vision or hearing  fast or irregular heartbeat  trouble passing urine or change in the amount of urine Side effects that usually do not require medical attention (report to your doctor or health care professional if they continue or are bothersome):  dizziness  dry mouth  irritability  sore throat  stomach pain  tiredness This list may not describe all possible side effects. Call your doctor for medical advice about side effects. You may report side effects to FDA at 1-800-FDA-1088. Where should I keep my medicine? Keep out of the reach of children. Store at room temperature between 15 and 30 degrees C (59 and 86 degrees F). Throw away any unused medicine after the expiration date. NOTE: This sheet is a summary. It may not cover all possible information. If you have questions about this medicine, talk to your doctor, pharmacist, or health care provider.  2020 Elsevier/Gold Standard (2014-10-05 13:44:42) Fluocinonide skin cream, gel, ointment, or topical solution What is this medicine? FLUOCINONIDE (floo oh SIN oh lone) is a corticosteroid. It is used on the skin to reduce swelling, redness, itching, and allergic reactions. This medicine may be used for other  purposes; ask your health care provider or pharmacist if you have questions. COMMON BRAND NAME(S): Fluovix, Fluovix Plus, Lidex, Lidex -E, Vanos What should I tell my health care provider before I take this medicine? They need to know if you have any of these conditions:  any active infection  diabetes  large areas of burned or damaged skin  skin wasting or thinning  an unusual or allergic reaction to fluocinonide, corticosteroids, sulfites, other medicines, foods, dyes, or preservatives  pregnant or trying to get pregnant  breast-feeding How should I use this medicine? This medicine is for external use only. Do not take by mouth. Follow the directions on the prescription label. You should wear a glove when applying the medicine. Apply a thin film of medicine to the affected area as directed, and rub in gently. Do not use on healthy skin or over large areas of skin. Do not get this medicine in your eyes. If you do, rinse out with plenty of cool tap water.  It is important not to use more medicine than prescribed. Do not use for more than 14 days. Talk to your pediatrician regarding the use of this medicine in children. Special care may be needed. If applying this medicine to the diaper area of a child, do not cover with tight-fitting diapers or plastic pants. This may increase the amount of medicine that passes through the skin and increase the risk of serious side effects. Elderly patients are more likely to have damaged skin through aging, and this may increase side effects. This medicine should only be used for brief periods and infrequently in older patients. Overdosage: If you think you have taken too much of this medicine contact a poison control center or emergency room at once. NOTE: This medicine is only for you. Do not share this medicine with others. What if I miss a dose? If you miss a dose, use it as soon as you can. If it is almost time for your next dose, use only that dose. Do  not use double or extra doses. What may interact with this medicine? Interactions are not expected. Do not use any other skin products without telling your doctor or health care professional. This list may not describe all possible interactions. Give your health care provider a list of all the medicines, herbs, non-prescription drugs, or dietary supplements you use. Also tell them if you smoke, drink alcohol, or use illegal drugs. Some items may interact with your medicine. What should I watch for while using this medicine? Tell your doctor or health care professional if your symptoms do not start to get better within one week. Tell your doctor or health care professional if you are exposed to anyone with measles or chickenpox, or if you develop sores or blisters that do not heal properly. What side effects may I notice from receiving this medicine? Side effects that you should report to your doctor or health care professional as soon as possible:  burning or itching of the skin  dark red spots on the skin  infection  painful, red, pus filled blisters in hair follicles  thinning of the skin, sunburn more likely especially on the face Side effects that usually do not require medical attention (report to your doctor or health care professional if they continue or are bothersome):  dry skin, irritation  unusual increased growth of hair on the face or body This list may not describe all possible side effects. Call your doctor for medical advice about side effects. You may report side effects to FDA at 1-800-FDA-1088. Where should I keep my medicine? Keep out of the reach of children. Store at room temperature between 15 and 30 degrees C (59 and 86 degrees F). Avoid excessive heat above 30 degrees C (86 degrees F). Do not freeze. Throw away any unused medicine after the expiration date. NOTE: This sheet is a summary. It may not cover all possible information. If you have questions about this  medicine, talk to your doctor, pharmacist, or health care provider.  2020 [employer]/Gold Standard (2008-01-14 16:59:47) Prednisolone tablets What is this medicine? PREDNISOLONE (pred NISS oh lone) is a corticosteroid. It is commonly used to treat inflammation of the skin, joints, lungs, and other organs. Common conditions treated include asthma, allergies, and arthritis. It is also used for other conditions, such as blood disorders and diseases of the adrenal glands. This medicine may be used for other purposes; ask your health care provider or pharmacist if you have questions. COMMON BRAND NAME(S):  Millipred, Millipred DP, Millipred DP 12-Day, Millipred DP 6 Day, Prednoral What should I tell my health care provider before I take this medicine? They need to know if you have any of these conditions:  Cushing's syndrome  diabetes  glaucoma  heart problems or disease  high blood pressure  infection such as herpes, measles, tuberculosis, or chickenpox  kidney disease  liver disease  mental problems  myasthenia gravis  osteoporosis  seizures  stomach ulcer or intestine disease including colitis and diverticulitis  thyroid problem  an unusual or allergic reaction to lactose, prednisolone, other medicines, foods, dyes, or preservatives  pregnant or trying to get pregnant  breast-feeding How should I use this medicine? Take this medicine by mouth with a glass of water. Follow the directions on the prescription label. Take it with food or milk to avoid stomach upset. If you are taking this medicine once a day, take it in the morning. Do not take more medicine than you are told to take. Do not suddenly stop taking your medicine because you may develop a severe reaction. Your doctor will tell you how much medicine to take. If your doctor wants you to stop the medicine, the dose may be slowly lowered over time to avoid any side effects. Talk to your pediatrician regarding the use of  this medicine in children. Special care may be needed. Overdosage: If you think you have taken too much of this medicine contact a poison control center or emergency room at once. NOTE: This medicine is only for you. Do not share this medicine with others. What if I miss a dose? If you miss a dose, take it as soon as you can. If it is almost time for your next dose, take only that dose. Do not take double or extra doses. What may interact with this medicine? Do not take this medicine with any of the following medications:  metyrapone  mifepristone This medicine may also interact with the following medications:  aminoglutethimide  amphotericin B  aspirin and aspirin-like medicines  barbiturates  certain medicines for diabetes, like glipizide or glyburide  cholestyramine  cholinesterase inhibitors  cyclosporine  digoxin  diuretics  ephedrine  male hormones, like estrogens and birth control pills  isoniazid  ketoconazole  NSAIDS, medicines for pain and inflammation, like ibuprofen or naproxen  phenytoin  rifampin  toxoids  vaccines  warfarin This list may not describe all possible interactions. Give your health care provider a list of all the medicines, herbs, non-prescription drugs, or dietary supplements you use. Also tell them if you smoke, drink alcohol, or use illegal drugs. Some items may interact with your medicine. What should I watch for while using this medicine? Visit your doctor or health care professional for regular checks on your progress. If you are taking this medicine over a prolonged period, carry an identification card with your name and address, the type and dose of your medicine, and your doctor's name and address. This medicine may increase your risk of getting an infection. Tell your doctor or health care professional if you are around anyone with measles or chickenpox, or if you develop sores or blisters that do not heal properly. If you  are going to have surgery, tell your doctor or health care professional that you have taken this medicine within the last twelve months. Ask your doctor or health care professional about your diet. You may need to lower the amount of salt you eat. This medicine may increase blood sugar. Ask your  healthcare provider if changes in diet or medicines are needed if you have diabetes. What side effects may I notice from receiving this medicine? Side effects that you should report to your doctor or health care professional as soon as possible:  allergic reactions like skin rash, itching or hives, swelling of the face, lips, or tongue  changes in emotions or moods  eye pain   signs and symptoms of high blood sugar such as being more thirsty or hungry or having to urinate more than normal. You may also feel very tired or have blurry vision.  signs and symptoms of infection like fever or chills; cough; sore throat; pain or trouble passing urine  slow growth in children (if used for longer periods of time)  swelling of ankles, feet  trouble sleeping  weak bones (if used for longer periods of time) Side effects that usually do not require medical attention (report to your doctor or health care professional if they continue or are bothersome):  nausea  skin problems, acne, thin and shiny skin  upset stomach  weight gain This list may not describe all possible side effects. Call your doctor for medical advice about side effects. You may report side effects to FDA at 1-800-FDA-1088. Where should I keep my medicine? Keep out of the reach of children. Store at room temperature between 15 and 30 degrees C (59 and 86 degrees F). Keep container tightly closed. Throw away any unused medicine after the expiration date. NOTE: This sheet is a summary. It may not cover all possible information. If you have questions about this medicine, talk to your doctor, pharmacist, or health care provider.  2020  [employer]vier/Gold Standard (2018-06-12 10:30:56)

## 2020-06-02 ENCOUNTER — Encounter: Payer: Self-pay | Admitting: Family Medicine

## 2020-06-03 MED FILL — TRUE METRIX TEST STRIP: 25 days supply | Qty: 100 | Fill #2

## 2020-06-10 MED FILL — AMLODIPINE BESYLATE 10 MG T: 10 | 30 days supply | Qty: 30 | Fill #4

## 2020-07-13 MED FILL — AMLODIPINE BESYLATE 10 MG T: 10 | 30 days supply | Qty: 30 | Fill #5

## 2020-08-10 ENCOUNTER — Ambulatory Visit: Payer: Self-pay

## 2020-08-10 ENCOUNTER — Telehealth: Payer: Self-pay

## 2020-08-10 NOTE — Telephone Encounter (Signed)
Copied from [id number]. Topic: General - Other >> Aug 10, 2020 11:52 AM Tamela Oddi wrote: Reason for CRM: Patient called to counsel his appt. Today due to a family emergency.  He stated he will call back to reschedule.

## 2020-08-11 MED FILL — AMLODIPINE BESYLATE 10 MG T: 10 | 30 days supply | Qty: 30 | Fill #6

## 2020-08-11 MED FILL — TRUE METRIX TEST STRIP: 25 days supply | Qty: 100 | Fill #3

## 2020-09-06 ENCOUNTER — Ambulatory Visit: Payer: Self-pay | Admitting: Family Medicine

## 2020-09-06 MED FILL — ?ATORVASTATIN 10 MG TABLET: 10 | 90 days supply | Qty: 90 | Fill #3

## 2020-09-14 MED FILL — TRUE METRIX GLUCOSE TEST ST: 25 days supply | Qty: 100 | Fill #4

## 2020-09-14 MED FILL — AMLODIPINE BESYLATE 10 MG T: 10 | 30 days supply | Qty: 30 | Fill #7

## 2020-10-14 MED FILL — AMLODIPINE BESYLATE 10 MG T: 10 | 30 days supply | Qty: 30 | Fill #8

## 2020-10-17 ENCOUNTER — Telehealth: Payer: Self-pay | Admitting: Family Medicine

## 2020-10-17 ENCOUNTER — Other Ambulatory Visit: Payer: Self-pay | Admitting: Family Medicine

## 2020-10-17 DIAGNOSIS — R7309 Other abnormal glucose: Secondary | ICD-10-CM

## 2020-10-17 DIAGNOSIS — E162 Hypoglycemia, unspecified: Secondary | ICD-10-CM

## 2020-10-17 DIAGNOSIS — E119 Type 2 diabetes mellitus without complications: Secondary | ICD-10-CM

## 2020-10-17 MED ORDER — TRUE METRIX BLOOD GLUCOSE TEST VI STRP: ORAL_STRIP | 12 refills | Status: DC

## 2020-10-17 MED FILL — ACCU-CHEK GUIDE TEST STRIP: 25 days supply | Qty: 100 | Fill #0

## 2020-10-17 NOTE — Telephone Encounter (Signed)
Done

## 2020-11-03 ENCOUNTER — Telehealth: Payer: Self-pay | Admitting: Family Medicine

## 2020-11-04 ENCOUNTER — Other Ambulatory Visit: Payer: Self-pay | Admitting: Family Medicine

## 2020-11-04 ENCOUNTER — Telehealth: Payer: Self-pay | Admitting: Family Medicine

## 2020-11-04 DIAGNOSIS — I10 Essential (primary) hypertension: Secondary | ICD-10-CM

## 2020-11-04 DIAGNOSIS — R21 Rash and other nonspecific skin eruption: Secondary | ICD-10-CM

## 2020-11-04 DIAGNOSIS — R7309 Other abnormal glucose: Secondary | ICD-10-CM

## 2020-11-04 DIAGNOSIS — E119 Type 2 diabetes mellitus without complications: Secondary | ICD-10-CM

## 2020-11-04 DIAGNOSIS — L299 Pruritus, unspecified: Secondary | ICD-10-CM

## 2020-11-04 MED ORDER — CETIRIZINE HCL 10 MG PO TABS: 10.0000 mg | ORAL_TABLET | Freq: Every day | ORAL | 3 refills | Status: DC

## 2020-11-04 MED ORDER — ATORVASTATIN CALCIUM 10 MG PO TABS: 10.0000 mg | ORAL_TABLET | Freq: Every day | ORAL | 3 refills | Status: DC

## 2020-11-04 MED ORDER — AMLODIPINE BESYLATE 10 MG PO TABS: 10.0000 mg | ORAL_TABLET | Freq: Every day | ORAL | 3 refills | Status: DC

## 2020-11-04 MED ORDER — METFORMIN HCL 500 MG PO TABS: 500.0000 mg | ORAL_TABLET | Freq: Two times a day (BID) | ORAL | 3 refills | Status: DC

## 2020-11-04 MED ORDER — HYDROXYZINE HCL 10 MG PO TABS: 10.0000 mg | ORAL_TABLET | Freq: Three times a day (TID) | ORAL | 3 refills | Status: DC | PRN

## 2020-11-04 MED ORDER — GLIPIZIDE 10 MG PO TABS: 10.0000 mg | ORAL_TABLET | Freq: Two times a day (BID) | ORAL | 3 refills | Status: DC

## 2020-11-04 NOTE — Telephone Encounter (Signed)
Patient states his insurance will not pay for a 30 day supply for Lipitor, but they will pay for a 90 day supply. Can this RX be resubmitted for 90 days?

## 2020-11-04 NOTE — Telephone Encounter (Signed)
Called and lvm  detail voicemail , pt will need make an appt , for pt was given enough refill until his appt. Pt will need call us back to make an appt and come his appt to get a 90 day  supply on his medication pt has not been seen since 05/2020 and no showed to his appt in 12/21.

## 2020-11-04 NOTE — Telephone Encounter (Signed)
Done

## 2020-11-04 NOTE — Telephone Encounter (Signed)
Pt was set up for an virtual appt on Monday , pt need to call at 12:00 pm for his appt due work.

## 2020-11-07 ENCOUNTER — Telehealth (INDEPENDENT_AMBULATORY_CARE_PROVIDER_SITE_OTHER): Payer: BC Managed Care – PPO | Admitting: Family Medicine

## 2020-11-07 DIAGNOSIS — R21 Rash and other nonspecific skin eruption: Secondary | ICD-10-CM

## 2020-11-07 DIAGNOSIS — L299 Pruritus, unspecified: Secondary | ICD-10-CM | POA: Diagnosis not present

## 2020-11-07 DIAGNOSIS — Z09 Encounter for follow-up examination after completed treatment for conditions other than malignant neoplasm: Secondary | ICD-10-CM

## 2020-11-07 DIAGNOSIS — Z760 Encounter for issue of repeat prescription: Secondary | ICD-10-CM

## 2020-11-07 DIAGNOSIS — R7309 Other abnormal glucose: Secondary | ICD-10-CM | POA: Diagnosis not present

## 2020-11-07 DIAGNOSIS — I10 Essential (primary) hypertension: Secondary | ICD-10-CM | POA: Diagnosis not present

## 2020-11-07 DIAGNOSIS — R634 Abnormal weight loss: Secondary | ICD-10-CM

## 2020-11-07 DIAGNOSIS — E119 Type 2 diabetes mellitus without complications: Secondary | ICD-10-CM

## 2020-11-07 MED ORDER — AMLODIPINE BESYLATE 10 MG PO TABS: 10.0000 mg | ORAL_TABLET | Freq: Every day | ORAL | 3 refills | Status: DC

## 2020-11-07 MED ORDER — ATORVASTATIN CALCIUM 10 MG PO TABS: 10.0000 mg | ORAL_TABLET | Freq: Every day | ORAL | 3 refills | Status: DC

## 2020-11-07 NOTE — Progress Notes (Signed)
Virtual Visit via Telephone Note  I connected with Johnny Hamilton on 11/07/20 at  3:20 PM EST by telephone and verified that I am speaking with the correct person using two identifiers.  Location: Patient: Home Provider: Office   I discussed the limitations, risks, security and privacy concerns of performing an evaluation and management service by telephone and the availability of in person appointments. I also discussed with the patient that there may be a patient responsible charge related to this service. The patient expressed understanding and agreed to proceed.   History of Present Illness:  Patient Active Problem List   Diagnosis Date Noted  . Chronic idiopathic constipation 01/15/2020  . Newly diagnosed diabetes (HCC) 12/08/2019  . Hypertension 12/08/2019    Current Status: Since his last office visit, he is doing well with no complaints. He states that hand rash has resolved. He is currently not taking any medication for Diabetes. He is making lifestyle changes to manage his blood glucose levels. He most recent normal range of preprandial blood glucose . vels have been between 80-90. He has seen low range of 62 and high of 94 since his last office visit. He denies fatigue, frequent urination, blurred vision, excessive hunger, excessive thirst, weight gain, weight loss, and poor wound healing. He continues to check his feet regularly. He denies fevers, chills, fatigue, recent infections, weight loss, and night sweats. He has not had any headaches, visual changes, dizziness, and falls. No chest pain, heart palpitations, cough and shortness of breath reported. Denies GI problems such as nausea, vomiting, diarrhea, and constipation. He has no reports of blood in stools, dysuria and hematuria. No depression or anxiety reported today. He is taking all medications as prescribed. He denies pain today.   Observations/Objective:  Telephone Virtual Visit  Assessment and Plan:  1. Newly  diagnosed diabetes (HCC) He will continue medication as prescribed, to decrease foods/beverages high in sugars and carbs and follow Heart Healthy or DASH diet. Increase physical activity to at least 30 minutes cardio exercise daily.  - atorvastatin (LIPITOR) 10 MG tablet; Take 1 tablet (10 mg total) by mouth daily.  Dispense: 90 tablet; Refill: 3  2. Hemoglobin A1c less than 7.0% Hgb A1c stable at 5.6 today. Monitor.   3. Hypertension, unspecified type He will continue to take medications as prescribed, to decrease high sodium intake, excessive alcohol intake, increase potassium intake, smoking cessation, and increase physical activity of at least 30 minutes of cardio activity daily. He will continue to follow Heart Healthy or DASH diet. - amLODipine (NORVASC) 10 MG tablet; Take 1 tablet (10 mg total) by mouth daily.  Dispense: 90 tablet; Refill: 3  4. Itching  5. Skin rash Stable.   6. Weight loss  7. Medication refill - amLODipine (NORVASC) 10 MG tablet; Take 1 tablet (10 mg total) by mouth daily.  Dispense: 90 tablet; Refill: 3 - atorvastatin (LIPITOR) 10 MG tablet; Take 1 tablet (10 mg total) by mouth daily.  Dispense: 90 tablet; Refill: 3  8. Follow up He will follow up in 3 months.   Meds ordered this encounter  Medications  . amLODipine (NORVASC) 10 MG tablet    Sig: Take 1 tablet (10 mg total) by mouth daily.    Dispense:  90 tablet    Refill:  3    Replace Caduet  . atorvastatin (LIPITOR) 10 MG tablet    Sig: Take 1 tablet (10 mg total) by mouth daily.    Dispense:  90 tablet  Refill:  3    Replace Caduet    No orders of the defined types were placed in this encounter.   Referral Orders  No referral(s) requested today    Raliegh Ip, MSN, ANE, FNP-BC [facility] Medicine/[facility] [address] [place], [place] [address] [phone]  I discussed the assessment and  treatment plan with the patient. The patient was provided an opportunity to ask questions and all were answered. The patient agreed with the plan and demonstrated an understanding of the instructions.   The patient was advised to call back or seek an in-person evaluation if the symptoms worsen or if the condition fails to improve as anticipated.  I provided 20 minutes of non-face-to-face time during this encounter.   Kallie Locks, FNP

## 2020-11-12 ENCOUNTER — Encounter: Payer: Self-pay | Admitting: Family Medicine

## 2021-02-03 ENCOUNTER — Encounter: Payer: Self-pay | Admitting: Nurse Practitioner

## 2021-02-03 ENCOUNTER — Ambulatory Visit: Payer: Self-pay | Admitting: Family Medicine

## 2021-02-03 ENCOUNTER — Other Ambulatory Visit: Payer: Self-pay

## 2021-02-03 ENCOUNTER — Ambulatory Visit (INDEPENDENT_AMBULATORY_CARE_PROVIDER_SITE_OTHER): Payer: BC Managed Care – PPO | Admitting: Nurse Practitioner

## 2021-02-03 VITALS — BP 128/83 | HR 64 | Temp 97.3°F | Wt 193.0 lb

## 2021-02-03 DIAGNOSIS — I10 Essential (primary) hypertension: Secondary | ICD-10-CM | POA: Diagnosis not present

## 2021-02-03 DIAGNOSIS — Z760 Encounter for issue of repeat prescription: Secondary | ICD-10-CM

## 2021-02-03 DIAGNOSIS — E119 Type 2 diabetes mellitus without complications: Secondary | ICD-10-CM | POA: Diagnosis not present

## 2021-02-03 LAB — POCT GLYCOSYLATED HEMOGLOBIN (HGB A1C)
HbA1c POC (<> result, manual entry): 5.1 % (ref 4.0–5.6)
HbA1c, POC (controlled diabetic range): 5.1 % (ref 0.0–7.0)
HbA1c, POC (prediabetic range): 5.1 % — AB (ref 5.7–6.4)
Hemoglobin A1C: 5.1 % (ref 4.0–5.6)

## 2021-02-03 LAB — GLUCOSE, POCT (MANUAL RESULT ENTRY): POC Glucose: 105 mg/dl — AB (ref 70–99)

## 2021-02-03 LAB — POCT URINALYSIS DIPSTICK
Bilirubin, UA: NEGATIVE
Blood, UA: NEGATIVE
Glucose, UA: NEGATIVE
Ketones, UA: NEGATIVE
Leukocytes, UA: NEGATIVE
Nitrite, UA: NEGATIVE
Protein, UA: NEGATIVE
Spec Grav, UA: 1.02 (ref 1.010–1.025)
Urobilinogen, UA: 0.2 E.U./dL
pH, UA: 6 (ref 5.0–8.0)

## 2021-02-03 MED ORDER — TRUE METRIX BLOOD GLUCOSE TEST VI STRP: ORAL_STRIP | 12 refills | Status: AC

## 2021-02-03 MED ORDER — AMLODIPINE BESYLATE 10 MG PO TABS
10.0000 mg | ORAL_TABLET | Freq: Every day | ORAL | 3 refills | Status: AC
Start: 2021-02-03 — End: ?

## 2021-02-03 NOTE — Patient Instructions (Signed)
Managing Your Hypertension Hypertension, also called high blood pressure, is when the force of the blood pressing against the walls of the arteries is too strong. Arteries are blood vessels that carry blood from your heart throughout your body. Hypertension forces the heart to work harder to pump blood and may cause the arteries to become narrow or stiff. Understanding blood pressure readings Your personal target blood pressure may vary depending on your medical conditions, your age, and other factors. A blood pressure reading includes a higher number over a lower number. Ideally, your blood pressure should be below 120/80. You should know that:  The first, or top, number is called the systolic pressure. It is a measure of the pressure in your arteries as your heart beats.  The second, or bottom number, is called the diastolic pressure. It is a measure of the pressure in your arteries as the heart relaxes. Blood pressure is classified into four stages. Based on your blood pressure reading, your health care provider may use the following stages to determine what type of treatment you need, if any. Systolic pressure and diastolic pressure are measured in a unit called mmHg. Normal  Systolic pressure: below 120.  Diastolic pressure: below 80. Elevated  Systolic pressure: 120-129.  Diastolic pressure: below 80. Hypertension stage 1  Systolic pressure: 130-139.  Diastolic pressure: 80-89. Hypertension stage 2  Systolic pressure: 140 or above.  Diastolic pressure: 90 or above. How can this condition affect me? Managing your hypertension is an important responsibility. Over time, hypertension can damage the arteries and decrease blood flow to important parts of the body, including the brain, heart, and kidneys. Having untreated or uncontrolled hypertension can lead to:  A heart attack.  A stroke.  A weakened blood vessel (aneurysm).  Heart failure.  Kidney damage.  Eye  damage.  Metabolic syndrome.  Memory and concentration problems.  Vascular dementia. What actions can I take to manage this condition? Hypertension can be managed by making lifestyle changes and possibly by taking medicines. Your health care provider will help you make a plan to bring your blood pressure within a normal range. Nutrition  Eat a diet that is high in fiber and potassium, and low in salt (sodium), added sugar, and fat. An example eating plan is called the Dietary Approaches to Stop Hypertension (DASH) diet. To eat this way: ? Eat plenty of fresh fruits and vegetables. Try to fill one-half of your plate at each meal with fruits and vegetables. ? Eat whole grains, such as whole-wheat pasta, brown rice, or whole-grain bread. Fill about one-fourth of your plate with whole grains. ? Eat low-fat dairy products. ? Avoid fatty cuts of meat, processed or cured meats, and poultry with skin. Fill about one-fourth of your plate with lean proteins such as fish, chicken without skin, beans, eggs, and tofu. ? Avoid pre-made and processed foods. These tend to be higher in sodium, added sugar, and fat.  Reduce your daily sodium intake. Most people with hypertension should eat less than 1,500 mg of sodium a day.   Lifestyle  Work with your health care provider to maintain a healthy body weight or to lose weight. Ask what an ideal weight is for you.  Get at least 30 minutes of exercise that causes your heart to beat faster (aerobic exercise) most days of the week. Activities may include walking, swimming, or biking.  Include exercise to strengthen your muscles (resistance exercise), such as weight lifting, as part of your weekly exercise routine. Try   to do these types of exercises for 30 minutes at least 3 days a week.  Do not use any products that contain nicotine or tobacco, such as cigarettes, e-cigarettes, and chewing tobacco. If you need help quitting, ask your health care  provider.  Control any long-term (chronic) conditions you have, such as high cholesterol or diabetes.  Identify your sources of stress and find ways to manage stress. This may include meditation, deep breathing, or making time for fun activities.   Alcohol use  Do not drink alcohol if: ? Your health care provider tells you not to drink. ? You are pregnant, may be pregnant, or are planning to become pregnant.  If you drink alcohol: ? Limit how much you use to:  0-1 drink a day for women.  0-2 drinks a day for men. ? Be aware of how much alcohol is in your drink. In the [place] one drink equals one 12 oz bottle of beer (355 mL), one 5 oz glass of wine (148 mL), or one 1 oz glass of hard liquor (44 mL). Medicines Your health care provider may prescribe medicine if lifestyle changes are not enough to get your blood pressure under control and if:  Your systolic blood pressure is 130 or higher.  Your diastolic blood pressure is 80 or higher. Take medicines only as told by your health care provider. Follow the directions carefully. Blood pressure medicines must be taken as told by your health care provider. The medicine does not work as well when you skip doses. Skipping doses also puts you at risk for problems. Monitoring Before you monitor your blood pressure:  Do not smoke, drink caffeinated beverages, or exercise within 30 minutes before taking a measurement.  Use the bathroom and empty your bladder (urinate).  Sit quietly for at least 5 minutes before taking measurements. Monitor your blood pressure at home as told by your health care provider. To do this:  Sit with your back straight and supported.  Place your feet flat on the floor. Do not cross your legs.  Support your arm on a flat surface, such as a table. Make sure your upper arm is at heart level.  Each time you measure, take two or three readings one minute apart and record the results. You may also need to have your  blood pressure checked regularly by your health care provider.   General information  Talk with your health care provider about your diet, exercise habits, and other lifestyle factors that may be contributing to hypertension.  Review all the medicines you take with your health care provider because there may be side effects or interactions.  Keep all visits as told by your health care provider. Your health care provider can help you create and adjust your plan for managing your high blood pressure. Where to find more information  National Heart, Lung, and Blood Institute: www.nhlbi.nih.gov  American Heart Association: www.heart.org Contact a health care provider if:  You think you are having a reaction to medicines you have taken.  You have repeated (recurrent) headaches.  You feel dizzy.  You have swelling in your ankles.  You have trouble with your vision. Get help right away if:  You develop a severe headache or confusion.  You have unusual weakness or numbness, or you feel faint.  You have severe pain in your chest or abdomen.  You vomit repeatedly.  You have trouble breathing. These symptoms may represent a serious problem that is an emergency. Do not wait   to see if the symptoms will go away. Get medical help right away. Call your local emergency services (911 in the [place] Do not drive yourself to the hospital. Summary  Hypertension is when the force of blood pumping through your arteries is too strong. If this condition is not controlled, it may put you at risk for serious complications.  Your personal target blood pressure may vary depending on your medical conditions, your age, and other factors. For most people, a normal blood pressure is less than 120/80.  Hypertension is managed by lifestyle changes, medicines, or both.  Lifestyle changes to help manage hypertension include losing weight, eating a healthy, low-sodium diet, exercising more, stopping smoking, and  limiting alcohol. This information is not intended to replace advice given to you by your health care provider. Make sure you discuss any questions you have with your health care provider. Document Revised: 10/16/2019 Document Reviewed: 08/11/2019 Elsevier Patient Education  2021 Elsevier Inc.  
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
## 2021-02-04 LAB — MICROALBUMIN, URINE: Microalbumin, Urine: 27.5 ug/mL

## 2021-08-04 ENCOUNTER — Encounter: Payer: Self-pay | Admitting: Nurse Practitioner

## 2021-08-04 ENCOUNTER — Other Ambulatory Visit: Payer: Self-pay

## 2021-08-04 ENCOUNTER — Ambulatory Visit: Payer: BC Managed Care – PPO | Admitting: Nurse Practitioner

## 2021-08-04 VITALS — BP 128/84 | HR 66 | Temp 97.3°F | Ht 70.0 in | Wt 185.8 lb

## 2021-08-04 DIAGNOSIS — Z125 Encounter for screening for malignant neoplasm of prostate: Secondary | ICD-10-CM | POA: Diagnosis not present

## 2021-08-04 DIAGNOSIS — I10 Essential (primary) hypertension: Secondary | ICD-10-CM | POA: Diagnosis not present

## 2021-08-04 DIAGNOSIS — E119 Type 2 diabetes mellitus without complications: Secondary | ICD-10-CM | POA: Diagnosis not present

## 2021-08-04 DIAGNOSIS — E1165 Type 2 diabetes mellitus with hyperglycemia: Secondary | ICD-10-CM | POA: Diagnosis not present

## 2021-08-04 DIAGNOSIS — R7309 Other abnormal glucose: Secondary | ICD-10-CM | POA: Diagnosis not present

## 2021-08-04 DIAGNOSIS — E781 Pure hyperglyceridemia: Secondary | ICD-10-CM | POA: Insufficient documentation

## 2021-08-04 LAB — POCT URINALYSIS DIP (CLINITEK)
Bilirubin, UA: NEGATIVE
Blood, UA: NEGATIVE
Glucose, UA: NEGATIVE mg/dL
Ketones, POC UA: NEGATIVE mg/dL
Leukocytes, UA: NEGATIVE
Nitrite, UA: NEGATIVE
POC PROTEIN,UA: NEGATIVE
Spec Grav, UA: 1.015 (ref 1.010–1.025)
Urobilinogen, UA: 0.2 U/dL
pH, UA: 6 (ref 5.0–8.0)

## 2021-08-04 LAB — POCT GLYCOSYLATED HEMOGLOBIN (HGB A1C)
HbA1c POC (<> result, manual entry): 5.3 % (ref 4.0–5.6)
HbA1c, POC (controlled diabetic range): 5.3 % (ref 0.0–7.0)
HbA1c, POC (prediabetic range): 5.3 % — AB (ref 5.7–6.4)
Hemoglobin A1C: 5.3 % (ref 4.0–5.6)

## 2021-08-04 MED ORDER — ATORVASTATIN CALCIUM 10 MG PO TABS: 10.0000 mg | ORAL_TABLET | Freq: Every day | ORAL | 3 refills | Status: AC
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
## 2021-08-04 NOTE — Patient Instructions (Signed)
Health Maintenance, Male Adopting a healthy lifestyle and getting preventive care are important in promoting health and wellness. Ask your health care provider about: The right schedule for you to have regular tests and exams. Things you can do on your own to prevent diseases and keep yourself healthy. What should I know about diet, weight, and exercise? Eat a healthy diet  Eat a diet that includes plenty of vegetables, fruits, low-fat dairy products, and lean protein. Do not eat a lot of foods that are high in solid fats, added sugars, or sodium. Maintain a healthy weight Body mass index (BMI) is a measurement that can be used to identify possible weight problems. It estimates body fat based on height and weight. Your health care provider can help determine your BMI and help you achieve or maintain a healthy weight. Get regular exercise Get regular exercise. This is one of the most important things you can do for your health. Most adults should: Exercise for at least 150 minutes each week. The exercise should increase your heart rate and make you sweat (moderate-intensity exercise). Do strengthening exercises at least twice a week. This is in addition to the moderate-intensity exercise. Spend less time sitting. Even light physical activity can be beneficial. Watch cholesterol and blood lipids Have your blood tested for lipids and cholesterol at 53 years of age, then have this test every 5 years. You may need to have your cholesterol levels checked more often if: Your lipid or cholesterol levels are high. You are older than 53 years of age. You are at high risk for heart disease. What should I know about cancer screening? Many types of cancers can be detected early and may often be prevented. Depending on your health history and family history, you may need to have cancer screening at various ages. This may include screening for: Colorectal cancer. Prostate cancer. Skin cancer. Lung  cancer. What should I know about heart disease, diabetes, and high blood pressure? Blood pressure and heart disease High blood pressure causes heart disease and increases the risk of stroke. This is more likely to develop in people who have high blood pressure readings or are overweight. Talk with your health care provider about your target blood pressure readings. Have your blood pressure checked: Every 3-5 years if you are 18-39 years of age. Every year if you are 40 years old or older. If you are between the ages of 65 and 75 and are a current or former smoker, ask your health care provider if you should have a one-time screening for abdominal aortic aneurysm (AAA). Diabetes Have regular diabetes screenings. This checks your fasting blood sugar level. Have the screening done: Once every three years after age 45 if you are at a normal weight and have a low risk for diabetes. More often and at a younger age if you are overweight or have a high risk for diabetes. What should I know about preventing infection? Hepatitis B If you have a higher risk for hepatitis B, you should be screened for this virus. Talk with your health care provider to find out if you are at risk for hepatitis B infection. Hepatitis C Blood testing is recommended for: Everyone born from 1945 through 1965. Anyone with known risk factors for hepatitis C. Sexually transmitted infections (STIs) You should be screened each year for STIs, including gonorrhea and chlamydia, if: You are sexually active and are younger than 53 years of age. You are older than 53 years of age and your   health care provider tells you that you are at risk for this type of infection. Your sexual activity has changed since you were last screened, and you are at increased risk for chlamydia or gonorrhea. Ask your health care provider if you are at risk. Ask your health care provider about whether you are at high risk for HIV. Your health care provider  may recommend a prescription medicine to help prevent HIV infection. If you choose to take medicine to prevent HIV, you should first get tested for HIV. You should then be tested every 3 months for as long as you are taking the medicine. Follow these instructions at home: Alcohol use Do not drink alcohol if your health care provider tells you not to drink. If you drink alcohol: Limit how much you have to 0-2 drinks a day. Know how much alcohol is in your drink. In the [place] one drink equals one 12 oz bottle of beer (355 mL), one 5 oz glass of wine (148 mL), or one 1 oz glass of hard liquor (44 mL). Lifestyle Do not use any products that contain nicotine or tobacco. These products include cigarettes, chewing tobacco, and vaping devices, such as e-cigarettes. If you need help quitting, ask your health care provider. Do not use street drugs. Do not share needles. Ask your health care provider for help if you need support or information about quitting drugs. General instructions Schedule regular health, dental, and eye exams. Stay current with your vaccines. Tell your health care provider if: You often feel depressed. You have ever been abused or do not feel safe at home. Summary Adopting a healthy lifestyle and getting preventive care are important in promoting health and wellness. Follow your health care provider's instructions about healthy diet, exercising, and getting tested or screened for diseases. Follow your health care provider's instructions on monitoring your cholesterol and blood pressure. This information is not intended to replace advice given to you by your health care provider. Make sure you discuss any questions you have with your health care provider. Document Revised: 01/30/2021 Document Reviewed: 01/30/2021 Elsevier Patient Education  2022 [employer]   Managing Your Hypertension Hypertension, also called high blood pressure, is when the force of the blood pressing  against the walls of the arteries is too strong. Arteries are blood vessels that carry blood from your heart throughout your body. Hypertension forces the heart to work harder to pump blood and may cause the arteries to become narrow or stiff. Understanding blood pressure readings Your personal target blood pressure may vary depending on your medical conditions, your age, and other factors. A blood pressure reading includes a higher number over a lower number. Ideally, your blood pressure should be below 120/80. You should know that: The first, or top, number is called the systolic pressure. It is a measure of the pressure in your arteries as your heart beats. The second, or bottom number, is called the diastolic pressure. It is a measure of the pressure in your arteries as the heart relaxes. Blood pressure is classified into four stages. Based on your blood pressure reading, your health care provider may use the following stages to determine what type of treatment you need, if any. Systolic pressure and diastolic pressure are measured in a unit called mmHg. Normal Systolic pressure: below 120. Diastolic pressure: below 80. Elevated Systolic pressure: 120-129. Diastolic pressure: below 80. Hypertension stage 1 Systolic pressure: 130-139. Diastolic pressure: 80-89. Hypertension stage 2 Systolic pressure: 140 or above. Diastolic pressure: 90  or above. How can this condition affect me? Managing your hypertension is an important responsibility. Over time, hypertension can damage the arteries and decrease blood flow to important parts of the body, including the brain, heart, and kidneys. Having untreated or uncontrolled hypertension can lead to: A heart attack. A stroke. A weakened blood vessel (aneurysm). Heart failure. Kidney damage. Eye damage. Metabolic syndrome. Memory and concentration problems. Vascular dementia. What actions can I take to manage this condition? Hypertension can be  managed by making lifestyle changes and possibly by taking medicines. Your health care provider will help you make a plan to bring your blood pressure within a normal range. Nutrition  Eat a diet that is high in fiber and potassium, and low in salt (sodium), added sugar, and fat. An example eating plan is called the Dietary Approaches to Stop Hypertension (DASH) diet. To eat this way: Eat plenty of fresh fruits and vegetables. Try to fill one-half of your plate at each meal with fruits and vegetables. Eat whole grains, such as whole-wheat pasta, brown rice, or whole-grain bread. Fill about one-fourth of your plate with whole grains. Eat low-fat dairy products. Avoid fatty cuts of meat, processed or cured meats, and poultry with skin. Fill about one-fourth of your plate with lean proteins such as fish, chicken without skin, beans, eggs, and tofu. Avoid pre-made and processed foods. These tend to be higher in sodium, added sugar, and fat. Reduce your daily sodium intake. Most people with hypertension should eat less than 1,500 mg of sodium a day. Lifestyle  Work with your health care provider to maintain a healthy body weight or to lose weight. Ask what an ideal weight is for you. Get at least 30 minutes of exercise that causes your heart to beat faster (aerobic exercise) most days of the week. Activities may include walking, swimming, or biking. Include exercise to strengthen your muscles (resistance exercise), such as weight lifting, as part of your weekly exercise routine. Try to do these types of exercises for 30 minutes at least 3 days a week. Do not use any products that contain nicotine or tobacco, such as cigarettes, e-cigarettes, and chewing tobacco. If you need help quitting, ask your health care provider. Control any long-term (chronic) conditions you have, such as high cholesterol or diabetes. Identify your sources of stress and find ways to manage stress. This may include meditation, deep  breathing, or making time for fun activities. Alcohol use Do not drink alcohol if: Your health care provider tells you not to drink. You are pregnant, may be pregnant, or are planning to become pregnant. If you drink alcohol: Limit how much you use to: 0-1 drink a day for women. 0-2 drinks a day for men. Be aware of how much alcohol is in your drink. In the [place] one drink equals one 12 oz bottle of beer (355 mL), one 5 oz glass of wine (148 mL), or one 1 oz glass of hard liquor (44 mL). Medicines Your health care provider may prescribe medicine if lifestyle changes are not enough to get your blood pressure under control and if: Your systolic blood pressure is 130 or higher. Your diastolic blood pressure is 80 or higher. Take medicines only as told by your health care provider. Follow the directions carefully. Blood pressure medicines must be taken as told by your health care provider. The medicine does not work as well when you skip doses. Skipping doses also puts you at risk for problems. Monitoring Before you monitor your  blood pressure: Do not smoke, drink caffeinated beverages, or exercise within 30 minutes before taking a measurement. Use the bathroom and empty your bladder (urinate). Sit quietly for at least 5 minutes before taking measurements. Monitor your blood pressure at home as told by your health care provider. To do this: Sit with your back straight and supported. Place your feet flat on the floor. Do not cross your legs. Support your arm on a flat surface, such as a table. Make sure your upper arm is at heart level. Each time you measure, take two or three readings one minute apart and record the results. You may also need to have your blood pressure checked regularly by your health care provider. General information Talk with your health care provider about your diet, exercise habits, and other lifestyle factors that may be contributing to hypertension. Review all the  medicines you take with your health care provider because there may be side effects or interactions. Keep all visits as told by your health care provider. Your health care provider can help you create and adjust your plan for managing your high blood pressure. Where to find more information National Heart, Lung, and Blood Institute: [employer]is American Heart Association: www.heart.org Contact a health care provider if: You think you are having a reaction to medicines you have taken. You have repeated (recurrent) headaches. You feel dizzy. You have swelling in your ankles. You have trouble with your vision. Get help right away if: You develop a severe headache or confusion. You have unusual weakness or numbness, or you feel faint. You have severe pain in your chest or abdomen. You vomit repeatedly. You have trouble breathing. These symptoms may represent a serious problem that is an emergency. Do not wait to see if the symptoms will go away. Get medical help right away. Call your local emergency services (911 in the [place] Do not drive yourself to the hospital. Summary Hypertension is when the force of blood pumping through your arteries is too strong. If this condition is not controlled, it may put you at risk for serious complications. Your personal target blood pressure may vary depending on your medical conditions, your age, and other factors. For most people, a normal blood pressure is less than 120/80. Hypertension is managed by lifestyle changes, medicines, or both. Lifestyle changes to help manage hypertension include losing weight, eating a healthy, low-sodium diet, exercising more, stopping smoking, and limiting alcohol. This information is not intended to replace advice given to you by your health care provider. Make sure you discuss any questions you have with your health care provider. Document Revised: 09/28/2019 Document Reviewed: 08/11/2019 Elsevier Patient Education   2022 [employer].

## 2021-08-05 LAB — COMP. METABOLIC PANEL (12)
AST: 25 IU/L (ref 0–40)
Albumin/Globulin Ratio: 2.1 (ref 1.2–2.2)
Albumin: 5.1 g/dL — ABNORMAL HIGH (ref 3.8–4.9)
Alkaline Phosphatase: 57 IU/L (ref 44–121)
BUN/Creatinine Ratio: 18 (ref 9–20)
BUN: 14 mg/dL (ref 6–24)
Bilirubin Total: 0.5 mg/dL (ref 0.0–1.2)
Calcium: 10 mg/dL (ref 8.7–10.2)
Chloride: 103 mmol/L (ref 96–106)
Creatinine, Ser: 0.76 mg/dL (ref 0.76–1.27)
Globulin, Total: 2.4 g/dL (ref 1.5–4.5)
Glucose: 86 mg/dL (ref 70–99)
Potassium: 4.6 mmol/L (ref 3.5–5.2)
Sodium: 140 mmol/L (ref 134–144)
Total Protein: 7.5 g/dL (ref 6.0–8.5)
eGFR: 107 mL/min/{1.73_m2} (ref >59)

## 2021-08-05 LAB — LIPID PANEL
Chol/HDL Ratio: 2.1 ratio (ref 0.0–5.0)
Cholesterol, Total: 105 mg/dL (ref 100–199)
HDL: 49 mg/dL (ref >39)
LDL Chol Calc (NIH): 41 mg/dL (ref 0–99)
Triglycerides: 69 mg/dL (ref 0–149)
VLDL Cholesterol Cal: 15 mg/dL (ref 5–40)

## 2021-08-05 LAB — PSA: Prostate Specific Ag, Serum: 4.1 ng/mL — ABNORMAL HIGH (ref 0.0–4.0)

## 2021-08-07 ENCOUNTER — Ambulatory Visit: Payer: BC Managed Care – PPO | Admitting: Nurse Practitioner

## 2021-08-29 ENCOUNTER — Ambulatory Visit (HOSPITAL_COMMUNITY)
Admission: EM | Admit: 2021-08-29 | Discharge: 2021-08-29 | Disposition: A | Discharge status: Home / Self Care | Payer: BC Managed Care – PPO | Attending: Emergency Medicine | Admitting: Emergency Medicine

## 2021-08-29 ENCOUNTER — Other Ambulatory Visit: Payer: Self-pay

## 2021-08-29 ENCOUNTER — Encounter (HOSPITAL_COMMUNITY): Payer: Self-pay | Admitting: Emergency Medicine

## 2021-08-29 DIAGNOSIS — J111 Influenza due to unidentified influenza virus with other respiratory manifestations: Secondary | ICD-10-CM

## 2021-08-29 MED ORDER — OSELTAMIVIR PHOSPHATE 75 MG PO CAPS: 75.0000 mg | ORAL_CAPSULE | Freq: Two times a day (BID) | ORAL | 0 refills | Status: DC
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
## 2021-08-29 NOTE — Discharge Instructions (Addendum)
They are most likely being caused by influenza A, your symptoms are consistent with the current presentation, influenza is a virus and will steadily improve with time  You may take Tamiflu twice daily for the next 5 days, if you are able to get this medication from the pharmacy then continue supportive treatment, symptoms will improve as the virus works as well after system whether or not you take this medication    You can take Tylenol and/or Ibuprofen as needed for fever reduction and pain relief.   For cough: honey 1/2 to 1 teaspoon (you can dilute the honey in water or another fluid).  You can also use guaifenesin and dextromethorphan for cough. You can use a humidifier for chest congestion and cough.  If you don't have a humidifier, you can sit in the bathroom with the hot shower running.      For sore throat: try warm salt water gargles, cepacol lozenges, throat spray, warm tea or water with lemon/honey, popsicles or ice, or OTC cold relief medicine for throat discomfort.   For congestion: take a daily anti-histamine like Zyrtec, Claritin, and a oral decongestant, such as pseudoephedrine.  You can also use Flonase 1-2 sprays in each nostril daily.   It is important to stay hydrated: drink plenty of fluids (water, gatorade/powerade/pedialyte, juices, or teas) to keep your throat moisturized and help further relieve irritation/discomfort.    

## 2021-08-29 NOTE — ED Triage Notes (Signed)
Pt having cough congestion, chills since yesterday was around someone with the flu.

## 2021-09-05 DIAGNOSIS — L700 Acne vulgaris: Secondary | ICD-10-CM | POA: Diagnosis not present

## 2021-09-05 DIAGNOSIS — L723 Sebaceous cyst: Secondary | ICD-10-CM | POA: Diagnosis not present

## 2021-09-07 ENCOUNTER — Ambulatory Visit: Payer: BC Managed Care – PPO | Admitting: Nurse Practitioner

## 2021-11-15 ENCOUNTER — Other Ambulatory Visit: Payer: Self-pay

## 2021-11-15 ENCOUNTER — Ambulatory Visit: Payer: Self-pay

## 2021-11-15 ENCOUNTER — Ambulatory Visit (INDEPENDENT_AMBULATORY_CARE_PROVIDER_SITE_OTHER): Payer: BC Managed Care – PPO | Admitting: Family

## 2021-11-15 DIAGNOSIS — G8929 Other chronic pain: Secondary | ICD-10-CM

## 2021-11-15 DIAGNOSIS — M25561 Pain in right knee: Secondary | ICD-10-CM

## 2021-11-15 MED ORDER — LIDOCAINE HCL 1 % IJ SOLN
5.0000 mL | INTRAMUSCULAR | Status: AC | PRN
  Administered 2021-11-15: 5 mL

## 2021-11-15 MED ORDER — METHYLPREDNISOLONE ACETATE 40 MG/ML IJ SUSP
40.0000 mg | INTRAMUSCULAR | Status: AC | PRN
  Administered 2021-11-15: 40 mg via INTRA_ARTICULAR
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
## 2021-11-22 ENCOUNTER — Telehealth: Payer: Self-pay | Admitting: Family

## 2021-11-22 ENCOUNTER — Other Ambulatory Visit: Payer: Self-pay | Admitting: Family

## 2021-11-22 DIAGNOSIS — G8929 Other chronic pain: Secondary | ICD-10-CM

## 2021-11-22 NOTE — Telephone Encounter (Signed)
MRI ordered. Pt informed. ?

## 2021-11-22 NOTE — Telephone Encounter (Signed)
Patient called advised his right knee is not feeling any better any better and is requesting an MRI. The number to contact patient is [phone] ?

## 2021-12-05 ENCOUNTER — Other Ambulatory Visit: Payer: Self-pay

## 2021-12-05 ENCOUNTER — Ambulatory Visit
Admission: RE | Admit: 2021-12-05 | Discharge: 2021-12-05 | Disposition: A | Discharge status: Home / Self Care | Payer: BC Managed Care – PPO | Source: Ambulatory Visit | Attending: Family | Admitting: Family

## 2021-12-05 DIAGNOSIS — M1711 Unilateral primary osteoarthritis, right knee: Secondary | ICD-10-CM | POA: Diagnosis not present

## 2021-12-05 DIAGNOSIS — R600 Localized edema: Secondary | ICD-10-CM | POA: Diagnosis not present

## 2021-12-05 DIAGNOSIS — M25461 Effusion, right knee: Secondary | ICD-10-CM | POA: Diagnosis not present

## 2021-12-05 DIAGNOSIS — G8929 Other chronic pain: Secondary | ICD-10-CM

## 2021-12-14 ENCOUNTER — Ambulatory Visit (INDEPENDENT_AMBULATORY_CARE_PROVIDER_SITE_OTHER): Payer: BC Managed Care – PPO | Admitting: Orthopedic Surgery

## 2021-12-14 DIAGNOSIS — M25561 Pain in right knee: Secondary | ICD-10-CM | POA: Diagnosis not present

## 2021-12-14 DIAGNOSIS — G8929 Other chronic pain: Secondary | ICD-10-CM | POA: Diagnosis not present

## 2021-12-15 ENCOUNTER — Encounter: Payer: Self-pay | Admitting: Orthopedic Surgery
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
## 2022-02-07 ENCOUNTER — Ambulatory Visit: Payer: BC Managed Care – PPO | Admitting: Nurse Practitioner

## 2022-03-02 DIAGNOSIS — M25512 Pain in left shoulder: Secondary | ICD-10-CM | POA: Diagnosis not present

## 2022-03-02 DIAGNOSIS — I10 Essential (primary) hypertension: Secondary | ICD-10-CM | POA: Diagnosis not present

## 2022-03-02 DIAGNOSIS — Z125 Encounter for screening for malignant neoplasm of prostate: Secondary | ICD-10-CM | POA: Diagnosis not present

## 2022-03-02 DIAGNOSIS — R634 Abnormal weight loss: Secondary | ICD-10-CM | POA: Diagnosis not present

## 2022-03-02 DIAGNOSIS — Z131 Encounter for screening for diabetes mellitus: Secondary | ICD-10-CM | POA: Diagnosis not present

## 2022-03-02 DIAGNOSIS — E782 Mixed hyperlipidemia: Secondary | ICD-10-CM | POA: Diagnosis not present

## 2022-03-02 DIAGNOSIS — Z79899 Other long term (current) drug therapy: Secondary | ICD-10-CM | POA: Diagnosis not present

## 2022-03-02 DIAGNOSIS — Z7182 Exercise counseling: Secondary | ICD-10-CM | POA: Diagnosis not present

## 2022-03-02 DIAGNOSIS — Z1329 Encounter for screening for other suspected endocrine disorder: Secondary | ICD-10-CM | POA: Diagnosis not present

## 2022-03-02 DIAGNOSIS — Z13228 Encounter for screening for other metabolic disorders: Secondary | ICD-10-CM | POA: Diagnosis not present

## 2022-03-02 DIAGNOSIS — G8929 Other chronic pain: Secondary | ICD-10-CM | POA: Diagnosis not present

## 2022-03-02 DIAGNOSIS — M25561 Pain in right knee: Secondary | ICD-10-CM | POA: Diagnosis not present

## 2022-03-30 DIAGNOSIS — M25561 Pain in right knee: Secondary | ICD-10-CM | POA: Diagnosis not present

## 2022-03-30 DIAGNOSIS — I10 Essential (primary) hypertension: Secondary | ICD-10-CM | POA: Diagnosis not present

## 2022-03-30 DIAGNOSIS — M25512 Pain in left shoulder: Secondary | ICD-10-CM | POA: Diagnosis not present

## 2022-03-30 DIAGNOSIS — G8929 Other chronic pain: Secondary | ICD-10-CM | POA: Diagnosis not present

## 2022-05-21 DIAGNOSIS — M25512 Pain in left shoulder: Secondary | ICD-10-CM | POA: Diagnosis not present

## 2022-05-21 DIAGNOSIS — G8929 Other chronic pain: Secondary | ICD-10-CM | POA: Diagnosis not present

## 2022-05-21 DIAGNOSIS — M7502 Adhesive capsulitis of left shoulder: Secondary | ICD-10-CM | POA: Diagnosis not present

## 2022-05-21 DIAGNOSIS — M19012 Primary osteoarthritis, left shoulder: Secondary | ICD-10-CM | POA: Diagnosis not present

## 2022-05-21 DIAGNOSIS — M25561 Pain in right knee: Secondary | ICD-10-CM | POA: Diagnosis not present

## 2022-06-09 DIAGNOSIS — M25512 Pain in left shoulder: Secondary | ICD-10-CM | POA: Diagnosis not present

## 2022-06-09 DIAGNOSIS — M7502 Adhesive capsulitis of left shoulder: Secondary | ICD-10-CM | POA: Diagnosis not present

## 2022-06-09 DIAGNOSIS — M25561 Pain in right knee: Secondary | ICD-10-CM | POA: Diagnosis not present

## 2022-06-09 DIAGNOSIS — G8929 Other chronic pain: Secondary | ICD-10-CM | POA: Diagnosis not present

## 2022-07-02 DIAGNOSIS — M25612 Stiffness of left shoulder, not elsewhere classified: Secondary | ICD-10-CM | POA: Diagnosis not present

## 2022-07-02 DIAGNOSIS — M7502 Adhesive capsulitis of left shoulder: Secondary | ICD-10-CM | POA: Diagnosis not present

## 2022-07-02 DIAGNOSIS — M25512 Pain in left shoulder: Secondary | ICD-10-CM | POA: Diagnosis not present

## 2022-07-02 DIAGNOSIS — M25561 Pain in right knee: Secondary | ICD-10-CM | POA: Diagnosis not present

## 2022-07-03 DIAGNOSIS — Z20822 Contact with and (suspected) exposure to covid-19: Secondary | ICD-10-CM | POA: Diagnosis not present

## 2022-07-03 DIAGNOSIS — I10 Essential (primary) hypertension: Secondary | ICD-10-CM | POA: Diagnosis not present

## 2022-07-03 DIAGNOSIS — Z03818 Encounter for observation for suspected exposure to other biological agents ruled out: Secondary | ICD-10-CM | POA: Diagnosis not present

## 2022-07-03 DIAGNOSIS — J209 Acute bronchitis, unspecified: Secondary | ICD-10-CM | POA: Diagnosis not present

## 2022-07-07 DIAGNOSIS — Z6826 Body mass index (BMI) 26.0-26.9, adult: Secondary | ICD-10-CM | POA: Diagnosis not present

## 2022-07-07 DIAGNOSIS — J209 Acute bronchitis, unspecified: Secondary | ICD-10-CM | POA: Diagnosis not present

## 2022-07-07 DIAGNOSIS — R051 Acute cough: Secondary | ICD-10-CM | POA: Diagnosis not present

## 2022-07-07 DIAGNOSIS — J0100 Acute maxillary sinusitis, unspecified: Secondary | ICD-10-CM | POA: Diagnosis not present

## 2022-07-16 DIAGNOSIS — M25561 Pain in right knee: Secondary | ICD-10-CM | POA: Diagnosis not present

## 2022-07-16 DIAGNOSIS — M25612 Stiffness of left shoulder, not elsewhere classified: Secondary | ICD-10-CM | POA: Diagnosis not present

## 2022-07-16 DIAGNOSIS — M25512 Pain in left shoulder: Secondary | ICD-10-CM | POA: Diagnosis not present

## 2022-07-16 DIAGNOSIS — M7502 Adhesive capsulitis of left shoulder: Secondary | ICD-10-CM | POA: Diagnosis not present

## 2022-07-23 DIAGNOSIS — M7502 Adhesive capsulitis of left shoulder: Secondary | ICD-10-CM | POA: Diagnosis not present

## 2022-07-23 DIAGNOSIS — M25512 Pain in left shoulder: Secondary | ICD-10-CM | POA: Diagnosis not present

## 2022-07-23 DIAGNOSIS — M25561 Pain in right knee: Secondary | ICD-10-CM | POA: Diagnosis not present

## 2022-07-23 DIAGNOSIS — M25612 Stiffness of left shoulder, not elsewhere classified: Secondary | ICD-10-CM | POA: Diagnosis not present

## 2022-07-30 DIAGNOSIS — M25512 Pain in left shoulder: Secondary | ICD-10-CM | POA: Diagnosis not present

## 2022-07-30 DIAGNOSIS — M25612 Stiffness of left shoulder, not elsewhere classified: Secondary | ICD-10-CM | POA: Diagnosis not present

## 2022-07-30 DIAGNOSIS — M25561 Pain in right knee: Secondary | ICD-10-CM | POA: Diagnosis not present

## 2022-07-30 DIAGNOSIS — M7502 Adhesive capsulitis of left shoulder: Secondary | ICD-10-CM | POA: Diagnosis not present

## 2022-08-06 DIAGNOSIS — M25561 Pain in right knee: Secondary | ICD-10-CM | POA: Diagnosis not present

## 2022-08-06 DIAGNOSIS — M7502 Adhesive capsulitis of left shoulder: Secondary | ICD-10-CM | POA: Diagnosis not present

## 2022-08-06 DIAGNOSIS — M25512 Pain in left shoulder: Secondary | ICD-10-CM | POA: Diagnosis not present

## 2022-08-06 DIAGNOSIS — M25612 Stiffness of left shoulder, not elsewhere classified: Secondary | ICD-10-CM | POA: Diagnosis not present

## 2022-08-20 DIAGNOSIS — M7502 Adhesive capsulitis of left shoulder: Secondary | ICD-10-CM | POA: Diagnosis not present

## 2022-08-20 DIAGNOSIS — M25612 Stiffness of left shoulder, not elsewhere classified: Secondary | ICD-10-CM | POA: Diagnosis not present

## 2022-08-20 DIAGNOSIS — M25561 Pain in right knee: Secondary | ICD-10-CM | POA: Diagnosis not present

## 2022-08-20 DIAGNOSIS — M25512 Pain in left shoulder: Secondary | ICD-10-CM | POA: Diagnosis not present

## 2022-08-27 DIAGNOSIS — M25512 Pain in left shoulder: Secondary | ICD-10-CM | POA: Diagnosis not present

## 2022-08-27 DIAGNOSIS — M7502 Adhesive capsulitis of left shoulder: Secondary | ICD-10-CM | POA: Diagnosis not present

## 2022-08-27 DIAGNOSIS — M25561 Pain in right knee: Secondary | ICD-10-CM | POA: Diagnosis not present

## 2022-08-27 DIAGNOSIS — M25612 Stiffness of left shoulder, not elsewhere classified: Secondary | ICD-10-CM | POA: Diagnosis not present

## 2023-08-14 IMAGING — MR MR KNEE*R* W/O CM
4 of 6 series · 23 of 40 positions shown · non-contrast
Comparison: Radiographs 11/15/2021.

CLINICAL DATA: Right lateral knee pain for 1 month. No known injury
or prior relevant surgery. Partial relief from injection 2.5 weeks
earlier.

EXAM:
MRI OF THE RIGHT KNEE WITHOUT CONTRAST
TECHNIQUE: Multiplanar, multisequence MR imaging of the knee was performed. No
intravenous contrast was administered.

[Series 5: T1 · coronal · 4.0mm · 0.29mm/px · 5 of 27 slices shown]
[im 1/27]
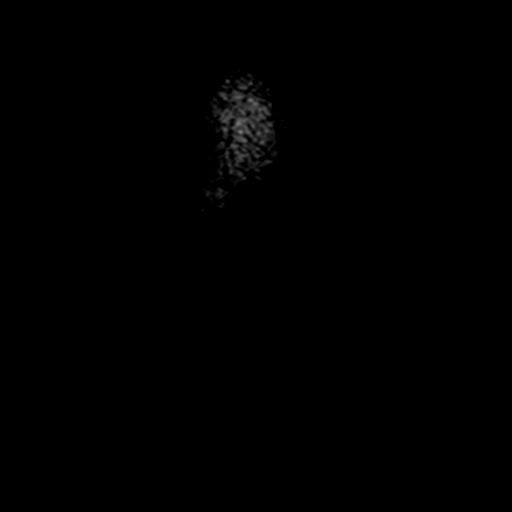
[im 6/27]
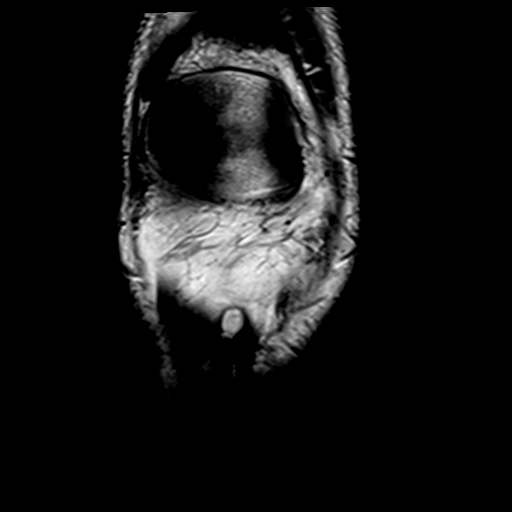
[im 11/27]
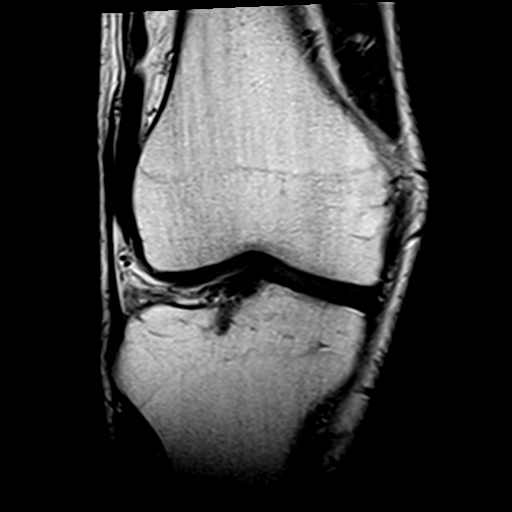
[im 16/27]
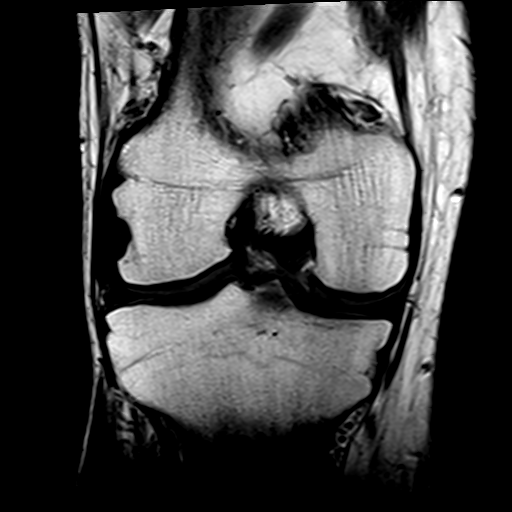
[im 27/27]
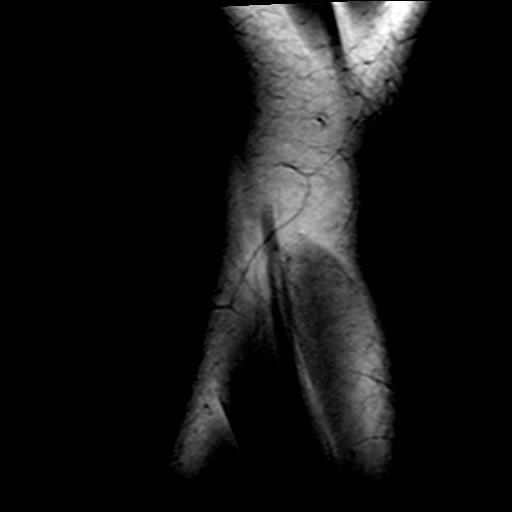

[Series 6: T2 fat-sat · coronal · 4.0mm · 0.59mm/px · 6 of 27 slices shown (1 of 2)]
[im 1/27]
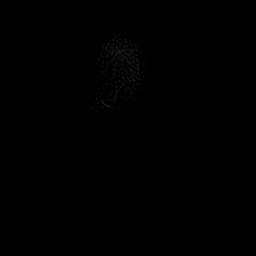
[im 6/27]
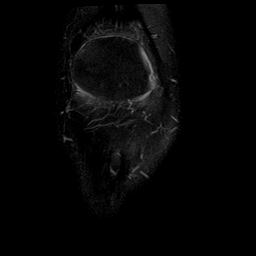
[im 11/27]
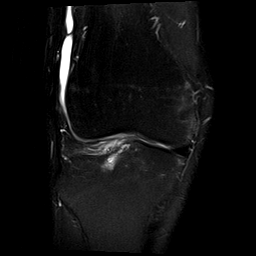
[im 16/27]
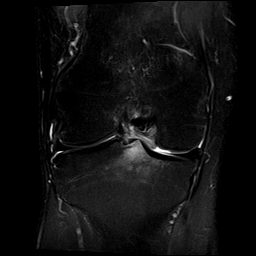
[im 21/27]
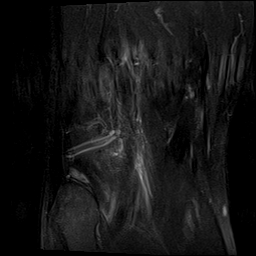
[im 27/27]
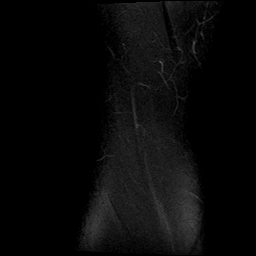

[Series 8: PD fat-sat · sagittal · 3.0mm · 0.29mm/px · 6 of 27 slices shown]
[im 1/27]
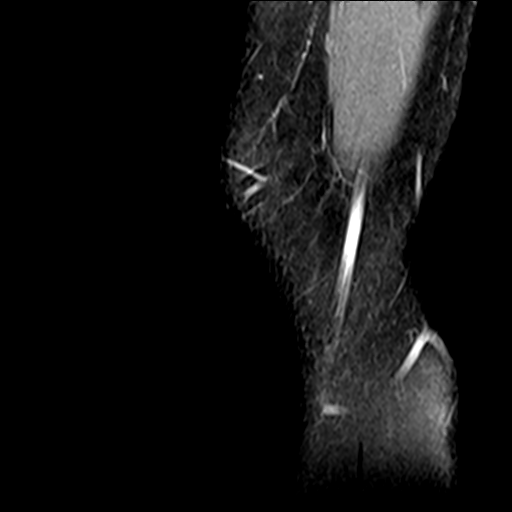
[im 6/27]
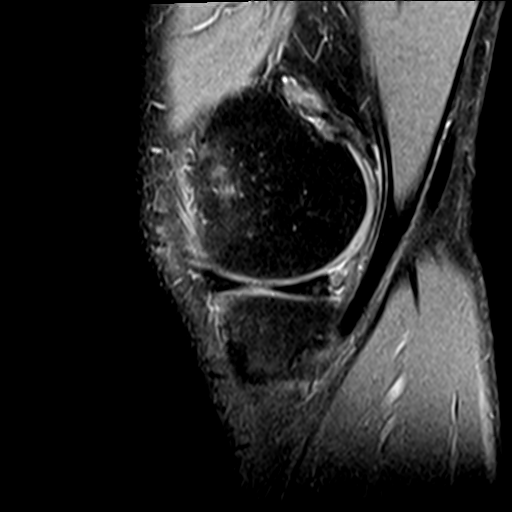
[im 11/27]
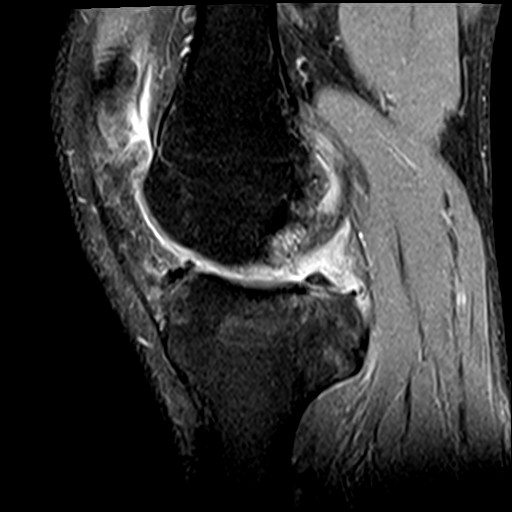
[im 16/27]
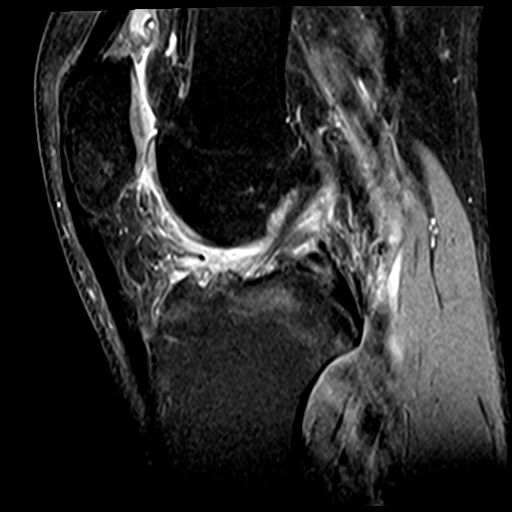
[im 21/27]
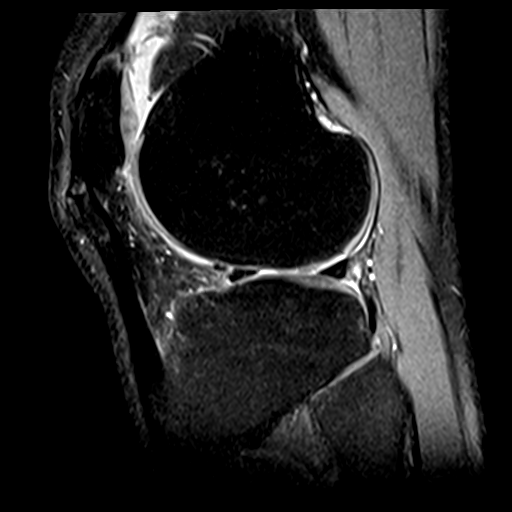
[im 27/27]
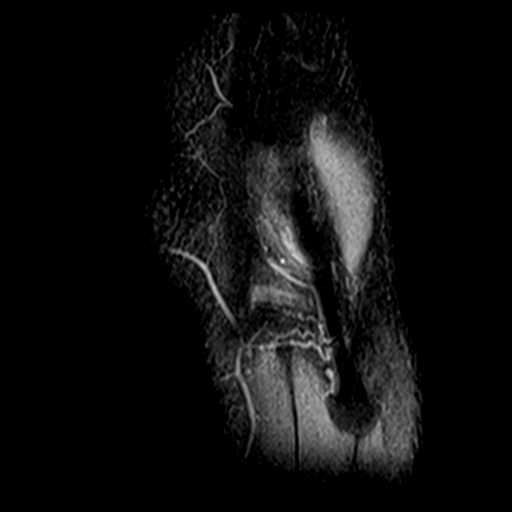

[Series 9: T2 fat-sat · sagittal · 3.0mm · 0.29mm/px · 6 of 27 slices shown (2 of 2)]
[im 1/27]
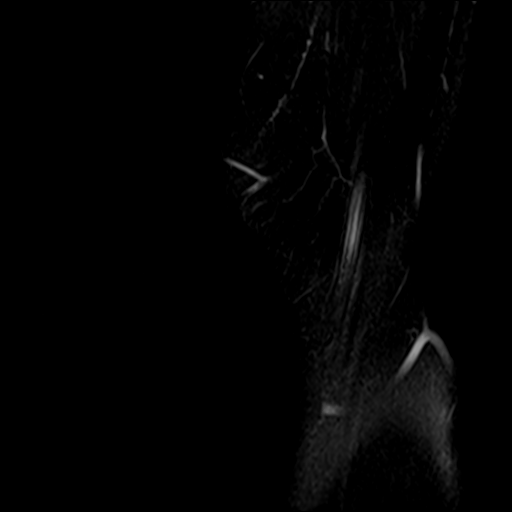
[im 6/27]
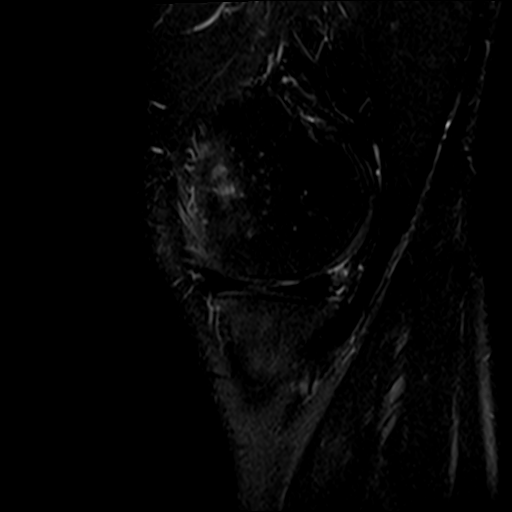
[im 11/27]
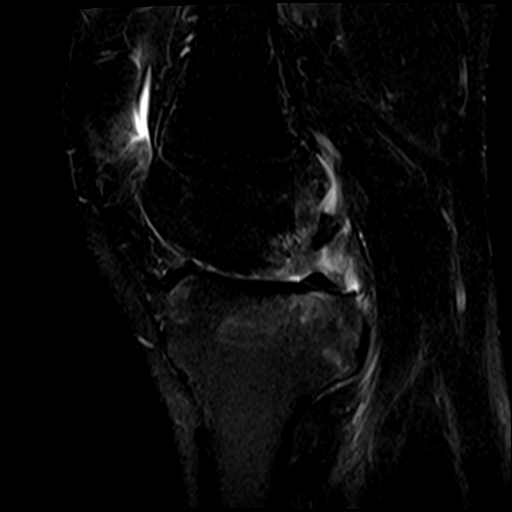
[im 16/27]
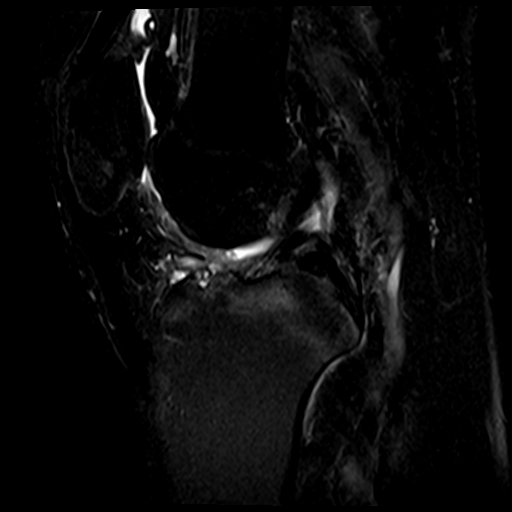
[im 21/27]
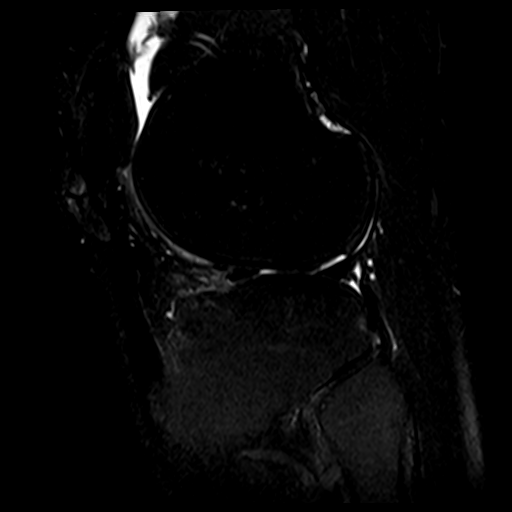
[im 27/27]
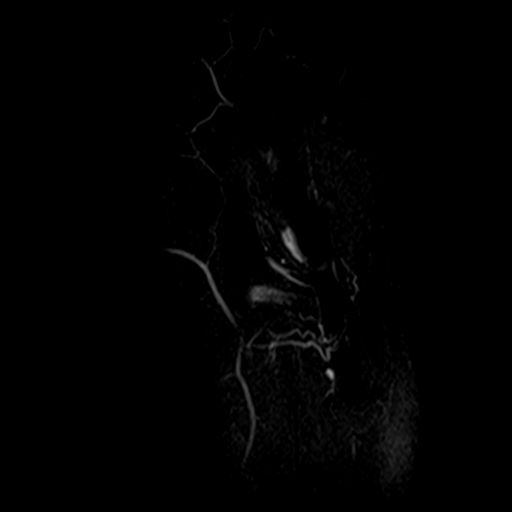

[23 of 40 positions shown; findings below may reference images not displayed]

FINDINGS: MENISCI

Medial meniscus:  Intact with normal morphology.

Lateral meniscus:  Intact with normal morphology.

LIGAMENTS

Cruciates:  Intact.

Collaterals:  Intact.

CARTILAGE

Patellofemoral: Mild fissuring of the cartilage over the lateral
patellar facet without subchondral cyst formation. Mild chondral
thinning and surface irregularity of the trochlear cartilage.

Medial:  Mild chondral thinning and surface irregularity.

Lateral: Mild chondral thinning and surface irregularity with a
nearly full-thickness defect over the posterior aspect of the
lateral femoral condyle.

MISCELLANEOUS

Joint:  Small joint effusion.

Popliteal Fossa: The popliteus muscle and tendon are intact. No
significant Baker's cyst.

Extensor Mechanism: Intact. There is spurring at the quadriceps and
patellar tendon insertions on the patella.

Bones: There is prominent marrow edema within the medial tibial
intercondylar eminence without evidence of discrete cortical
fracture on the T1 weighted images. There is surrounding soft tissue
edema within the intercondylar notch without focal fluid collection.

Other: No other significant periarticular soft tissue findings.
IMPRESSION: 1. Prominent marrow T2 hyperintensity within the medial tibial
intercondylar eminence with adjacent soft tissue edema in the
intercondylar notch, findings likely indicating synovitis. No
discrete cortical fracture identified.
2. Tricompartmental degenerative changes as described.
3. The menisci, cruciate and collateral ligaments are intact. Small
joint effusion.
